# Patient Record
Sex: Female | Born: 1950 | Race: Black or African American | Hispanic: No | Marital: Single | Smoking: Former smoker
Health system: Southern US, Community
[De-identification: ages and names within clinical notes are randomized; demographics above are authoritative.]

## PROBLEM LIST (undated history)

## (undated) DIAGNOSIS — I5181 Takotsubo syndrome: Secondary | ICD-10-CM

## (undated) DIAGNOSIS — I10 Essential (primary) hypertension: Secondary | ICD-10-CM

## (undated) DIAGNOSIS — K219 Gastro-esophageal reflux disease without esophagitis: Secondary | ICD-10-CM

## (undated) DIAGNOSIS — Z9289 Personal history of other medical treatment: Secondary | ICD-10-CM

## (undated) HISTORY — DX: Personal history of other medical treatment: Z92.89

## (undated) HISTORY — DX: Takotsubo syndrome: I51.81

---

## 2014-04-04 DIAGNOSIS — Z9289 Personal history of other medical treatment: Secondary | ICD-10-CM

## 2014-04-04 DIAGNOSIS — I5181 Takotsubo syndrome: Secondary | ICD-10-CM

## 2014-04-04 HISTORY — DX: Takotsubo syndrome: I51.81

## 2014-04-04 HISTORY — DX: Personal history of other medical treatment: Z92.89

## 2014-04-20 ENCOUNTER — Emergency Department (HOSPITAL_COMMUNITY): Payer: BC Managed Care – PPO

## 2014-04-20 ENCOUNTER — Encounter (HOSPITAL_COMMUNITY): Payer: Self-pay | Admitting: Emergency Medicine

## 2014-04-20 ENCOUNTER — Inpatient Hospital Stay (HOSPITAL_COMMUNITY)
Admission: EM | Admit: 2014-04-20 | Discharge: 2014-04-22 | DRG: 281 | Disposition: A | Discharge status: Home / Self Care | Payer: BC Managed Care – PPO | Attending: Cardiology | Admitting: Cardiology

## 2014-04-20 DIAGNOSIS — I428 Other cardiomyopathies: Secondary | ICD-10-CM | POA: Diagnosis present

## 2014-04-20 DIAGNOSIS — I5181 Takotsubo syndrome: Secondary | ICD-10-CM

## 2014-04-20 DIAGNOSIS — I214 Non-ST elevation (NSTEMI) myocardial infarction: Principal | ICD-10-CM | POA: Diagnosis present

## 2014-04-20 DIAGNOSIS — Z87891 Personal history of nicotine dependence: Secondary | ICD-10-CM

## 2014-04-20 DIAGNOSIS — R7989 Other specified abnormal findings of blood chemistry: Secondary | ICD-10-CM

## 2014-04-20 DIAGNOSIS — I10 Essential (primary) hypertension: Secondary | ICD-10-CM | POA: Diagnosis present

## 2014-04-20 DIAGNOSIS — Z886 Allergy status to analgesic agent status: Secondary | ICD-10-CM

## 2014-04-20 DIAGNOSIS — K219 Gastro-esophageal reflux disease without esophagitis: Secondary | ICD-10-CM | POA: Diagnosis present

## 2014-04-20 DIAGNOSIS — I129 Hypertensive chronic kidney disease with stage 1 through stage 4 chronic kidney disease, or unspecified chronic kidney disease: Secondary | ICD-10-CM | POA: Diagnosis present

## 2014-04-20 DIAGNOSIS — N189 Chronic kidney disease, unspecified: Secondary | ICD-10-CM | POA: Diagnosis present

## 2014-04-20 DIAGNOSIS — Z23 Encounter for immunization: Secondary | ICD-10-CM

## 2014-04-20 DIAGNOSIS — Z8249 Family history of ischemic heart disease and other diseases of the circulatory system: Secondary | ICD-10-CM

## 2014-04-20 DIAGNOSIS — I059 Rheumatic mitral valve disease, unspecified: Secondary | ICD-10-CM | POA: Diagnosis present

## 2014-04-20 HISTORY — DX: Essential (primary) hypertension: I10

## 2014-04-20 HISTORY — DX: Gastro-esophageal reflux disease without esophagitis: K21.9

## 2014-04-20 LAB — CBC WITH DIFFERENTIAL/PLATELET
BASOS ABS: 0 10*3/uL (ref 0.0–0.1)
BASOS PCT: 0 % (ref 0–1)
EOS ABS: 0 10*3/uL (ref 0.0–0.7)
EOS PCT: 0 % (ref 0–5)
HEMATOCRIT: 42.8 % (ref 36.0–46.0)
Hemoglobin: 14.7 g/dL (ref 12.0–15.0)
LYMPHS PCT: 27 % (ref 12–46)
Lymphs Abs: 2.6 10*3/uL (ref 0.7–4.0)
MCH: 31.3 pg (ref 26.0–34.0)
MCHC: 34.3 g/dL (ref 30.0–36.0)
MCV: 91.3 fL (ref 78.0–100.0)
MONO ABS: 0.7 10*3/uL (ref 0.1–1.0)
Monocytes Relative: 7 % (ref 3–12)
Neutro Abs: 6.5 10*3/uL (ref 1.7–7.7)
Neutrophils Relative %: 66 % (ref 43–77)
PLATELETS: 353 10*3/uL (ref 150–400)
RBC: 4.69 MIL/uL (ref 3.87–5.11)
RDW: 13.2 % (ref 11.5–15.5)
WBC: 9.8 10*3/uL (ref 4.0–10.5)

## 2014-04-20 LAB — TROPONIN I
TROPONIN I: 2.08 ng/mL — AB (ref <0.30)
Troponin I: 1.4 ng/mL (ref <0.30)

## 2014-04-20 LAB — BASIC METABOLIC PANEL
BUN: 17 mg/dL (ref 6–23)
CHLORIDE: 102 meq/L (ref 96–112)
CO2: 24 meq/L (ref 19–32)
Calcium: 10.7 mg/dL — ABNORMAL HIGH (ref 8.4–10.5)
Creatinine, Ser: 1.11 mg/dL — ABNORMAL HIGH (ref 0.50–1.10)
GFR calc Af Amer: 61 mL/min — ABNORMAL LOW (ref >90)
GFR calc non Af Amer: 52 mL/min — ABNORMAL LOW (ref >90)
GLUCOSE: 143 mg/dL — AB (ref 70–99)
Potassium: 4 mEq/L (ref 3.7–5.3)
Sodium: 144 mEq/L (ref 137–147)

## 2014-04-20 LAB — HEPARIN LEVEL (UNFRACTIONATED): HEPARIN UNFRACTIONATED: 0.93 [IU]/mL — AB (ref 0.30–0.70)

## 2014-04-20 LAB — MRSA PCR SCREENING: MRSA by PCR: NEGATIVE

## 2014-04-20 LAB — PRO B NATRIURETIC PEPTIDE: PRO B NATRI PEPTIDE: 14801 pg/mL — AB (ref 0–125)

## 2014-04-20 MED ORDER — ATORVASTATIN CALCIUM 80 MG PO TABS
80.0000 mg | ORAL_TABLET | Freq: Every day | ORAL | Status: DC
  Administered 2014-04-20 – 2014-04-21 (×2): 80 mg via ORAL
  Filled 2014-04-20 (×4): qty 1

## 2014-04-20 MED ORDER — DOXEPIN HCL 75 MG PO CAPS
75.0000 mg | ORAL_CAPSULE | Freq: Every day | ORAL | Status: DC
  Administered 2014-04-21 – 2014-04-22 (×2): 75 mg via ORAL
  Filled 2014-04-20 (×2): qty 1

## 2014-04-20 MED ORDER — HEPARIN SODIUM (PORCINE) 5000 UNIT/ML IJ SOLN: 4000.0000 [IU] | Freq: Once | INTRAMUSCULAR | Status: DC

## 2014-04-20 MED ORDER — HEPARIN (PORCINE) IN NACL 100-0.45 UNIT/ML-% IJ SOLN
600.0000 [IU]/h | INTRAMUSCULAR | Status: DC
  Administered 2014-04-20: 600 [IU]/h via INTRAVENOUS
  Filled 2014-04-20: qty 250

## 2014-04-20 MED ORDER — SODIUM CHLORIDE 0.9 % IJ SOLN: 3.0000 mL | INTRAMUSCULAR | Status: DC | PRN

## 2014-04-20 MED ORDER — SODIUM CHLORIDE 0.9 % IV SOLN: 1.0000 mL/kg/h | INTRAVENOUS | Status: DC

## 2014-04-20 MED ORDER — ASPIRIN 81 MG PO CHEW
324.0000 mg | CHEWABLE_TABLET | Freq: Once | ORAL | Status: AC
  Administered 2014-04-20: 324 mg via ORAL
  Filled 2014-04-20: qty 4

## 2014-04-20 MED ORDER — SODIUM CHLORIDE 0.9 % IV SOLN: 250.0000 mL | INTRAVENOUS | Status: DC | PRN

## 2014-04-20 MED ORDER — PANTOPRAZOLE SODIUM 40 MG PO TBEC
40.0000 mg | DELAYED_RELEASE_TABLET | Freq: Every day | ORAL | Status: DC
  Administered 2014-04-21 – 2014-04-22 (×2): 40 mg via ORAL
  Filled 2014-04-20 (×2): qty 1

## 2014-04-20 MED ORDER — CLONIDINE HCL 0.1 MG PO TABS
0.1000 mg | ORAL_TABLET | Freq: Two times a day (BID) | ORAL | Status: DC
  Administered 2014-04-21 – 2014-04-22 (×3): 0.1 mg via ORAL
  Filled 2014-04-20 (×5): qty 1

## 2014-04-20 MED ORDER — HEPARIN (PORCINE) IN NACL 100-0.45 UNIT/ML-% IJ SOLN
500.0000 [IU]/h | INTRAMUSCULAR | Status: DC
  Filled 2014-04-20: qty 250

## 2014-04-20 MED ORDER — ASPIRIN EC 81 MG PO TBEC
81.0000 mg | DELAYED_RELEASE_TABLET | Freq: Every day | ORAL | Status: DC
  Administered 2014-04-21 – 2014-04-22 (×2): 81 mg via ORAL
  Filled 2014-04-20 (×2): qty 1

## 2014-04-20 MED ORDER — SODIUM CHLORIDE 0.9 % IV BOLUS (SEPSIS)
250.0000 mL | Freq: Once | INTRAVENOUS | Status: AC
  Administered 2014-04-20: 250 mL via INTRAVENOUS

## 2014-04-20 MED ORDER — SODIUM CHLORIDE 0.9 % IJ SOLN
3.0000 mL | Freq: Two times a day (BID) | INTRAMUSCULAR | Status: DC
  Administered 2014-04-21: 3 mL via INTRAVENOUS

## 2014-04-20 MED ORDER — SODIUM CHLORIDE 0.9 % IV SOLN
INTRAVENOUS | Status: AC
  Administered 2014-04-20: 16:00:00 via INTRAVENOUS

## 2014-04-20 MED ORDER — ASPIRIN 81 MG PO CHEW
81.0000 mg | CHEWABLE_TABLET | ORAL | Status: AC
  Administered 2014-04-21: 81 mg via ORAL
  Filled 2014-04-20: qty 1

## 2014-04-20 MED ORDER — HEPARIN (PORCINE) IN NACL 100-0.45 UNIT/ML-% IJ SOLN: 14.0000 [IU]/kg/h | Freq: Once | INTRAMUSCULAR | Status: DC

## 2014-04-20 MED ORDER — SODIUM CHLORIDE 0.9 % IV SOLN
INTRAVENOUS | Status: DC
Start: 2014-04-20 — End: 2014-04-22
  Administered 2014-04-20: 16:00:00 via INTRAVENOUS

## 2014-04-20 MED ORDER — HEPARIN BOLUS VIA INFUSION
3000.0000 [IU] | Freq: Once | INTRAVENOUS | Status: AC
  Administered 2014-04-20: 3000 [IU] via INTRAVENOUS

## 2014-04-20 MED ORDER — ACETAMINOPHEN 325 MG PO TABS: 650.0000 mg | ORAL_TABLET | ORAL | Status: DC | PRN

## 2014-04-20 MED ORDER — METOPROLOL TARTRATE 1 MG/ML IV SOLN
5.0000 mg | Freq: Once | INTRAVENOUS | Status: AC
  Administered 2014-04-20: 5 mg via INTRAVENOUS
  Filled 2014-04-20: qty 5

## 2014-04-20 MED ORDER — ONDANSETRON HCL 4 MG/2ML IJ SOLN: 4.0000 mg | Freq: Four times a day (QID) | INTRAMUSCULAR | Status: DC | PRN

## 2014-04-20 MED ORDER — NITROGLYCERIN 0.4 MG SL SUBL: 0.4000 mg | SUBLINGUAL_TABLET | SUBLINGUAL | Status: DC | PRN

## 2014-04-20 NOTE — ED Notes (Signed)
Cp with n/weakness/sob/dizziness since last night.

## 2014-04-20 NOTE — ED Notes (Signed)
CRITICAL VALUE ALERT  Critical value received:  Trop 2.0  dae of notification:  04/20/2014  Time of notification:  1422  Critical value read back:yes  Nurse who received alert:  SDW

## 2014-04-20 NOTE — Progress Notes (Signed)
ANTICOAGULATION CONSULT NOTE - Initial Consult  Pharmacy Consult for Heparin Indication: chest pain/ACS  Allergies  Allergen Reactions  . Asa [Aspirin]     Patient Measurements: Height: 5\' 6"  (167.6 cm) Weight: 112 lb (50.803 kg) IBW/kg (Calculated) : 59.3  Vital Signs: Temp: 97.5 F (36.4 C) (06/17 1302) BP: 135/90 mmHg (06/17 1302) Pulse Rate: 109 (06/17 1302)  Labs:  Recent Labs  04/20/14 1315  HGB 14.7  HCT 42.8  PLT 353  CREATININE 1.11*  TROPONINI 2.08*    Estimated Creatinine Clearance: 42.7 ml/min (by C-G formula based on Cr of 1.11).   Medical History: Past Medical History  Diagnosis Date  . Hypertension   . GERD (gastroesophageal reflux disease)     Medications:  Scheduled:    Assessment: 63 yo F admit with chest pain.  CBC reviewed.  No bleeding noted.    Goal of Therapy:  Heparin level 0.3-0.7 units/ml Monitor platelets by anticoagulation protocol: Yes   Plan:  Give 3000 units bolus x 1 Start heparin infusion at 600 units/hr Check anti-Xa level in 6 hours and daily while on heparin Continue to monitor H&H and platelets  [place], [facility] 04/20/2014,2:20 PM

## 2014-04-20 NOTE — H&P (Signed)
Patient ID: Krystal Travis MRN: [id number], DOB/AGE: January 10, 1951   Admit date: 04/20/2014   Primary Physician: Wynona Meals, FNP Primary Cardiologist: New  Pt. Profile:  Krystal Travis is a 63 y.o. Female with a history of HTN, GERD and no prior cardiac history who presented to the [facility] today complaining of chest pain. Her troponin was noted to be elevated and she was sent to [facility] for further management.  She complains of intermittent, aching substernal CP that radiated to her left arm beginning last night at 5:00 PM. She was restless all night and in the morning experienced intermittent chest pain lasting about 10 minutes at a time. She also noted associated lightheadedness, nausea, lower back pain, diaphoresis and SOB. Patient rated the pain as a 4/10 in severity. She is not having any chest discomfort at this time. She has no prior history of similar episodes or prior cardiac disease. She denies PND, orthopnea or LE edema. Her mother recently had a MI and she is currently on FMLA to help care for her. She currently works for a group home. She does not smoke and has no history of bleeding.     Problem List  Past Medical History  Diagnosis Date  . Hypertension   . GERD (gastroesophageal reflux disease)   . CKD (chronic kidney disease)     mild     History reviewed. No pertinent past surgical history.   Allergies  Allergies  Allergen Reactions  . Asa [Aspirin]      Home Medications  Prior to Admission medications   Medication Sig Start Date End Date Taking? Authorizing Provider  cloNIDine (CATAPRES) 0.1 MG tablet Take 0.1 mg by mouth 2 (two) times daily. 04/01/14  Yes Historical Provider, MD  doxepin (SINEQUAN) 75 MG capsule Take 75 mg by mouth daily. 03/24/14  Yes Historical Provider, MD  omeprazole (PRILOSEC) 20 MG capsule Take 20 mg by mouth 2 (two) times daily. 04/01/14  Yes Historical Provider, MD  verapamil (CALAN-SR) 240 MG CR tablet Take 240 mg by mouth daily.  04/01/14  Yes Historical Provider, MD    Family History  Family History  Problem Relation Age of Onset  . Heart attack Mother    Family Status  Relation Status Death Age  . Mother Alive      Social History  History   Social History  . Marital Status: Single    Spouse Name: N/A    Number of Children: N/A  . Years of Education: N/A   Occupational History  . Not on file.   Social History Main Topics  . Smoking status: Former Smoker    Quit date: 04/20/2006  . Smokeless tobacco: Not on file  . Alcohol Use: Yes     Comment: occasional  . Drug Use: No  . Sexual Activity: Not on file   Other Topics Concern  . Not on file   Social History Narrative  . No narrative on file     Review of Systems General:  No chills, fever, night sweats or weight changes.  Cardiovascular:  ++ chest pain, dyspnea on exertion, edema, orthopnea, palpitations, paroxysmal nocturnal dyspnea. Dermatological: No rash, lesions/masses Respiratory: No cough, dyspnea Urologic: No hematuria, dysuria Abdominal:   No nausea, vomiting, diarrhea, bright red blood per rectum, melena, or hematemesis Neurologic:  No visual changes, wkns, changes in mental status. All other systems reviewed and are otherwise negative except as noted above.  Physical Exam  Blood pressure 105/74, pulse 79, temperature  98.5 F (36.9 C), temperature source Oral, resp. rate 15, height 5\' 6"  (1.676 m), weight 112 lb (50.803 kg), SpO2 98.00%.  General: Pleasant, NAD Psych: Normal affect. Neuro: Alert and oriented X 3. Moves all extremities spontaneously. HEENT: Normal  Neck: Supple without bruits or JVD. Lungs:  Resp regular and unlabored, CTA. Heart: RRR no s3, s4, or murmurs. + S3 gallop Abdomen: Soft, non-tender, non-distended, BS + x 4.  Extremities: No clubbing, cyanosis or edema. DP/PT/Radials 2+ and equal bilaterally.  Labs   Recent Labs  04/20/14 1315  TROPONINI 2.08*   Lab Results  Component Value Date     WBC 9.8 04/20/2014   HGB 14.7 04/20/2014   HCT 42.8 04/20/2014   MCV 91.3 04/20/2014   PLT 353 04/20/2014     Recent Labs Lab 04/20/14 1315  NA 144  K 4.0  CL 102  CO2 24  BUN 17  CREATININE 1.11*  CALCIUM 10.7*  GLUCOSE 143*    Radiology/Studies  Dg Chest Port 1 View  04/20/2014   CLINICAL DATA:  Chest pain and weakness with history of hypertension ; previous tobacco use.  EXAM: PORTABLE CHEST - 1 VIEW  COMPARISON:  None.  FINDINGS: The lungs are well-expanded and clear. The heart and mediastinal structures are normal. There is no pleural effusion. The bony thorax exhibits no acute abnormality.  IMPRESSION: COPD.  There is no acute cardiopulmonary disease.      ECG  Not in system, but non specific ST changes per report from [facility]. Probable anterior infarct age undetermined. Also diffuse T-wave inversions suggestive of diffuse ischemia.   ASSESSMENT AND PLAN  Krystal Travis is a 63 y.o. Female with a history of HTN, GERD and no prior cardiac history who presented to the [facility] ED today complaining of chest pain. Her troponin was noted to be elevated and [facility].   NSTEMI- trop 2.08. Chest pain free now. Hold off on NTG gtt if remains CP free -- She was placed on heparin and will continue on this -- Will start ASA, statin. WIll hold off on BB for now due to soft BPs -- Cardiac cath tomorrow- NPO after midnight  Elevated BNP- 14.8 K. She has no s/s CHF. Her CXR is clear.  -- Will order a 2D ECHO.  HTN- continue clonidine 0.1 BID and verapamil 240mg  CR  GERD- continue PPI  Signed, Thereasa ParkinSTERN, KATHRYN, PA-C 04/20/2014, 7:56 PM  Pager [phone]  The patient was seen on the step down unit with Thereasa ParkinKathryn Stern, PA-C.  The patient is not currently having any chest discomfort.  She is not short of breath.  She began having intermittent chest discomfort yesterday afternoon with intermittent radiation down the left arm.  She had intermittent episodes of  diaphoresis but no nausea and vomiting.  [facility] went to [facility] emergency room where her EKG shows sinus rhythm with wide spread T wave inversion in inferolateral leads and her serum troponin was greater than 2.  She also had a significantly elevated proBNP at 14,800 although no other symptoms or signs of CHF.  Her chest x-ray was reviewed personally by me and shows normal heart size and clear lungs without evidence of CHF.  Her physical examination reveals an S4 gallop suggesting possible diastolic dysfunction.  Will check echocardiogram.  Her chart lists aspirin as an allergy.  I talked with her about this.  She is not allergic to aspirin.  However too much aspirin or Advil causes  her to have gastritis symptoms. She will need beta blocker going forward as treatment for her coronary artery disease and for that reason I will not continue the verapamil.  Tonight her blood pressure is soft and we will hold off on starting a beta blocker until her blood pressure improves.  We will plan for cardiac catheterization in a.m.The risks and benefits of a cardiac catheterization including, but not limited to, death, stroke, MI, kidney damage and bleeding were discussed with the patient who indicates understanding and agrees to proceed.

## 2014-04-20 NOTE — Progress Notes (Signed)
ANTICOAGULATION CONSULT NOTE - Follow-Up Consult  Pharmacy Consult for Heparin Indication: chest pain/ACS  Allergies  Allergen Reactions  . Asa [Aspirin]     Hurts stomach - pt can still take.     Patient Measurements: Height: 5\' 6"  (167.6 cm) Weight: 112 lb (50.803 kg) IBW/kg (Calculated) : 59.3  Vital Signs: Temp: 98.5 F (36.9 C) (06/17 1946) Temp src: Oral (06/17 1946) BP: 103/72 mmHg (06/17 2130) Pulse Rate: 81 (06/17 2130)  Labs:  Recent Labs  04/20/14 1315 04/20/14 2124  HGB 14.7  --   HCT 42.8  --   PLT 353  --   HEPARINUNFRC  --  0.93*  CREATININE 1.11*  --   TROPONINI 2.08*  --     Estimated Creatinine Clearance: 42.7 ml/min (by C-G formula based on Cr of 1.11).   Medical History: Past Medical History  Diagnosis Date  . Hypertension   . GERD (gastroesophageal reflux disease)     Medications:  Scheduled:  . sodium chloride   Intravenous STAT  . [START ON 04/21/2014] aspirin EC  81 mg Oral Daily  . atorvastatin  80 mg Oral q1800  . cloNIDine  0.1 mg Oral BID  . [START ON 04/21/2014] doxepin  75 mg Oral Daily  . [START ON 04/21/2014] pantoprazole  40 mg Oral QAC breakfast    Assessment: 63 yo F admit with chest pain.  CBC reviewed.  No bleeding noted.  Initial heparin level supratherapeutic on 600 units/hr.  Goal of Therapy:  Heparin level 0.3-0.7 units/ml Monitor platelets by anticoagulation protocol: Yes   Plan:  1. Decrease IV heparin to 500 units/hr. 2. Will recheck heparin level with AM labs. 3. F/u daily heparin level and CBC. 4. F/u plans after cath tomorrow.  Tad Moore, BCPS  Clinical Pharmacist Pager [phone]  04/20/2014 10:13 PM

## 2014-04-20 NOTE — ED Notes (Signed)
Continues to deny pain  

## 2014-04-20 NOTE — ED Provider Notes (Addendum)
CSN: [id number]     Arrival date & time 04/20/14  1252 History  This chart was scribed for Vanetta Mulders, MD by Leona Carry, ED Scribe. The patient was seen in [facility]. The patient's care was started at 1:54 PM.   Chief Complaint  Patient presents with  . Chest Pain    Patient is a 63 y.o. female presenting with chest pain. The history is provided by the patient. No language interpreter was used.  Chest Pain Pain location:  Substernal area Pain quality: aching   Pain radiates to:  L arm Pain radiates to the back: no   Onset quality:  Gradual Duration:  1 day Timing:  Intermittent Chronicity:  New Ineffective treatments:  None tried Associated symptoms: back pain (lower back), diaphoresis, dizziness, nausea and shortness of breath   Associated symptoms: no abdominal pain, no cough, no fever, no headache and not vomiting   Risk factors: hypertension   Risk factors: no coronary artery disease    HPI Comments: Krystal Travis is a 63 y.o. female who presents to the Emergency Department complaining of intermittent, aching substernal CP beginning last night at 5:00 PM. She states that the pain radiates to her left arm, but does not radiate into her back. Patient rates that pain as a 4/10 in severity. Patient states that each pain episode lasts about 10 minutes. She denies modifying factors. She has no prior history of similar pain episodes. Patient is not having any chest discomfort at this time. She reports associated nausea, SOB, and dizziness, and lower back pain. She denies fever, vomiting, abdominal pain, visual changes, cough, rhinorrhea, sore throat, dysuria, leg swelling, rash, hx of bleeding easily, neck pain, HA, or confusion. She has no history of cardiac disease.  PCP is Dr[facility] Lyn Hollingshead at [facility]  Past Medical History  Diagnosis Date  . Hypertension   . GERD (gastroesophageal reflux disease)    History reviewed. No pertinent past surgical  history. History reviewed. No pertinent family history. History  Substance Use Topics  . Smoking status: Former Smoker    Quit date: 04/20/2006  . Smokeless tobacco: Not on file  . Alcohol Use: Yes     Comment: occasional   OB History   Grav Para Term Preterm Abortions TAB SAB Ect Mult Living                 Review of Systems  Constitutional: Positive for chills and diaphoresis. Negative for fever.  HENT: Negative for congestion, rhinorrhea and sore throat.   Eyes: Negative for visual disturbance.  Respiratory: Positive for shortness of breath. Negative for cough.   Cardiovascular: Positive for chest pain. Negative for leg swelling.  Gastrointestinal: Positive for nausea. Negative for vomiting, abdominal pain and diarrhea.  Genitourinary: Negative for dysuria and difficulty urinating.  Musculoskeletal: Positive for back pain (lower back). Negative for neck pain.  Skin: Negative for rash.  Neurological: Positive for dizziness. Negative for light-headedness and headaches.  Hematological: Does not bruise/bleed easily.  Psychiatric/Behavioral: Negative for confusion.    Allergies  Asa  Home Medications   Prior to Admission medications   Medication Sig Start Date End Date Taking? Authorizing Provider  cloNIDine (CATAPRES) 0.1 MG tablet Take 0.1 mg by mouth 2 (two) times daily. 04/01/14  Yes Historical Provider, MD  doxepin (SINEQUAN) 75 MG capsule Take 75 mg by mouth daily. 03/24/14  Yes Historical Provider, MD  omeprazole (PRILOSEC) 20 MG capsule Take 20 mg by mouth 2 (two) times daily. 04/01/14  Yes Historical Provider, MD  verapamil (CALAN-SR) 240 MG CR tablet Take 240 mg by mouth daily. 04/01/14  Yes Historical Provider, MD   Triage Vitals: BP 135/90  Pulse 109  Temp(Src) 97.5 F (36.4 C)  Resp 18  Ht 5\' 6"  (1.676 m)  Wt 112 lb (50.803 kg)  BMI 18.09 kg/m2  SpO2 100% Physical Exam  Nursing note and vitals reviewed. Constitutional: She is oriented to person, place, and  time. She appears well-developed and well-nourished. No distress.  HENT:  Head: Normocephalic and atraumatic.  Eyes: Conjunctivae and EOM are normal.  Neck: Neck supple. No tracheal deviation present.  Cardiovascular: Tachycardia present.   Pulmonary/Chest: Effort normal and breath sounds normal. No respiratory distress. She has no wheezes. She has no rales.  O2 saturation is 100% on room air.  Abdominal: Bowel sounds are normal. There is no tenderness.  Musculoskeletal: Normal range of motion. She exhibits no edema.  Neurological: She is alert and oriented to person, place, and time. No cranial nerve deficit.  Skin: Skin is warm and dry.  Psychiatric: She has a normal mood and affect. Her behavior is normal.    ED Course  Procedures (including critical care time) DIAGNOSTIC STUDIES: Oxygen Saturation is 100% on room air, normal by my interpretation.    COORDINATION OF CARE: 2:15 PM-Discussed treatment plan which includes IV fluids, heparin bolus, head CT, CXR, BNP, troponin, BMP, CBC with diff with pt at bedside and pt agreed to plan.    Labs Review Labs Reviewed  TROPONIN I - Abnormal; Notable for the following:    Troponin I 2.08 (*)    All other components within normal limits  BASIC METABOLIC PANEL - Abnormal; Notable for the following:    Glucose, Bld 143 (*)    Creatinine, Ser 1.11 (*)    Calcium 10.7 (*)    GFR calc non Af Amer 52 (*)    GFR calc Af Amer 61 (*)    All other components within normal limits  PRO B NATRIURETIC PEPTIDE - Abnormal; Notable for the following:    Pro B Natriuretic peptide (BNP) 14801.0 (*)    All other components within normal limits  CBC WITH DIFFERENTIAL   Results for orders placed during the hospital encounter of 04/20/14  TROPONIN I      Result Value Ref Range   Troponin I 2.08 (*) <0.30 ng/mL  BASIC METABOLIC PANEL      Result Value Ref Range   Sodium 144  137 - 147 mEq/L   Potassium 4.0  3.7 - 5.3 mEq/L   Chloride 102  96 - 112  mEq/L   CO2 24  19 - 32 mEq/L   Glucose, Bld 143 (*) 70 - 99 mg/dL   BUN 17  6 - 23 mg/dL   Creatinine, Ser 9.141.11 (*) 0.50 - 1.10 mg/dL   Calcium 78.210.7 (*) 8.4 - 10.5 mg/dL   GFR calc non Af Amer 52 (*) >90 mL/min   GFR calc Af Amer 61 (*) >90 mL/min  CBC WITH DIFFERENTIAL      Result Value Ref Range   WBC 9.8  4.0 - 10.5 K/uL   RBC 4.69  3.87 - 5.11 MIL/uL   Hemoglobin 14.7  12.0 - 15.0 g/dL   HCT 95.642.8  21.336.0 - 08.646.0 %   MCV 91.3  78.0 - 100.0 fL   MCH 31.3  26.0 - 34.0 pg   MCHC 34.3  30.0 - 36.0 g/dL   RDW 57.813.2  46.911.5 -  15.5 %   Platelets 353  150 - 400 K/uL   Neutrophils Relative % 66  43 - 77 %   Neutro Abs 6.5  1.7 - 7.7 K/uL   Lymphocytes Relative 27  12 - 46 %   Lymphs Abs 2.6  0.7 - 4.0 K/uL   Monocytes Relative 7  3 - 12 %   Monocytes Absolute 0.7  0.1 - 1.0 K/uL   Eosinophils Relative 0  0 - 5 %   Eosinophils Absolute 0.0  0.0 - 0.7 K/uL   Basophils Relative 0  0 - 1 %   Basophils Absolute 0.0  0.0 - 0.1 K/uL  PRO B NATRIURETIC PEPTIDE      Result Value Ref Range   Pro B Natriuretic peptide (BNP) 14801.0 (*) 0 - 125 pg/mL     Imaging Review Dg Chest Port 1 View  04/20/2014   CLINICAL DATA:  Chest pain and weakness with history of hypertension ; previous tobacco use.  EXAM: PORTABLE CHEST - 1 VIEW  COMPARISON:  None.  FINDINGS: The lungs are well-expanded and clear. The heart and mediastinal structures are normal. There is no pleural effusion. The bony thorax exhibits no acute abnormality.  IMPRESSION: COPD.  There is no acute cardiopulmonary disease.   Electronically Signed   By: David  [place]   On: 04/20/2014 15:34     EKG Interpretation None      Date: 04/20/2014  Rate: 103  Rhythm: sinus tachycardia  QRS Axis: normal  Intervals: QT prolonged  ST/T Wave abnormalities: nonspecific ST changes  Conduction Disutrbances:none  Narrative Interpretation:   Old EKG Reviewed: none available Probable anterior infarct age undetermined. Also diffuse T-wave inversions  suggestive of diffuse ischemia.  CRITICAL CARE Performed by: Vanetta Mulders Total critical care time: 30 Critical care time was exclusive of separately billable procedures and treating other patients. Critical care was necessary to treat or prevent imminent or life-threatening deterioration. Critical care was time spent personally by me on the following activities: development of treatment plan with patient and/or surrogate as well as nursing, discussions with consultants, evaluation of patient's response to treatment, examination of patient, obtaining history from patient or surrogate, ordering and performing treatments and interventions, ordering and review of laboratory studies, ordering and review of radiographic studies, pulse oximetry and re-evaluation of patient's condition.    MDM   Final diagnoses:  Non-STEMI (non-ST elevated myocardial infarction)    The patient is clear without any chest pain lasting 15 or 20 minutes states that most of his 10 minutes but it's been recurrent. Could represent unstable angina or a non-STEMI. EKG does show ST inversion inferiorly and laterally. Patient currently having no chest pain. Patient not truly allergic to aspirin so given aspirin. Patient started on heparin. We'll discuss with cardiology at [facility]. As stated patient currently chest pain-free. If tachycardia persist will treat with Lopressor.  I personally performed the services described in this documentation, which was scribed in my presence. The recorded information has been reviewed and is accurate.     Vanetta Mulders, MD 04/20/14 1519  Vanetta Mulders, MD 04/20/14 1540

## 2014-04-21 ENCOUNTER — Encounter (HOSPITAL_COMMUNITY)
Admission: EM | Disposition: A | Discharge status: Home / Self Care | Payer: Self-pay | Source: Home / Self Care | Attending: Cardiology

## 2014-04-21 DIAGNOSIS — I059 Rheumatic mitral valve disease, unspecified: Secondary | ICD-10-CM

## 2014-04-21 DIAGNOSIS — I214 Non-ST elevation (NSTEMI) myocardial infarction: Secondary | ICD-10-CM

## 2014-04-21 HISTORY — PX: LEFT HEART CATHETERIZATION WITH CORONARY ANGIOGRAM: SHX5451

## 2014-04-21 LAB — CBC
HEMATOCRIT: 36.3 % (ref 36.0–46.0)
HEMOGLOBIN: 12.3 g/dL (ref 12.0–15.0)
MCH: 30.8 pg (ref 26.0–34.0)
MCHC: 33.9 g/dL (ref 30.0–36.0)
MCV: 90.8 fL (ref 78.0–100.0)
Platelets: 285 10*3/uL (ref 150–400)
RBC: 4 MIL/uL (ref 3.87–5.11)
RDW: 13 % (ref 11.5–15.5)
WBC: 8.9 10*3/uL (ref 4.0–10.5)

## 2014-04-21 LAB — LIPID PANEL
CHOL/HDL RATIO: 2.4 ratio
CHOLESTEROL: 231 mg/dL — AB (ref 0–200)
HDL: 95 mg/dL (ref >39)
LDL Cholesterol: 120 mg/dL — ABNORMAL HIGH (ref 0–99)
TRIGLYCERIDES: 82 mg/dL (ref <150)
VLDL: 16 mg/dL (ref 0–40)

## 2014-04-21 LAB — PROTIME-INR
INR: 1.11 (ref 0.00–1.49)
PROTHROMBIN TIME: 14.1 s (ref 11.6–15.2)

## 2014-04-21 LAB — BASIC METABOLIC PANEL
BUN: 17 mg/dL (ref 6–23)
CALCIUM: 9.8 mg/dL (ref 8.4–10.5)
CO2: 23 meq/L (ref 19–32)
Chloride: 104 mEq/L (ref 96–112)
Creatinine, Ser: 1.1 mg/dL (ref 0.50–1.10)
GFR calc Af Amer: 61 mL/min — ABNORMAL LOW (ref >90)
GFR calc non Af Amer: 53 mL/min — ABNORMAL LOW (ref >90)
GLUCOSE: 104 mg/dL — AB (ref 70–99)
POTASSIUM: 3.7 meq/L (ref 3.7–5.3)
Sodium: 143 mEq/L (ref 137–147)

## 2014-04-21 LAB — HEPARIN LEVEL (UNFRACTIONATED)
Heparin Unfractionated: 0.46 IU/mL (ref 0.30–0.70)
Heparin Unfractionated: 0.51 IU/mL (ref 0.30–0.70)

## 2014-04-21 LAB — TROPONIN I
Troponin I: 1.24 ng/mL (ref <0.30)
Troponin I: 0.73 ng/mL (ref <0.30)

## 2014-04-21 SURGERY — LEFT HEART CATHETERIZATION WITH CORONARY ANGIOGRAM
Anesthesia: LOCAL

## 2014-04-21 MED ORDER — CARVEDILOL 3.125 MG PO TABS
3.1250 mg | ORAL_TABLET | Freq: Two times a day (BID) | ORAL | Status: DC
  Administered 2014-04-21 – 2014-04-22 (×3): 3.125 mg via ORAL
  Filled 2014-04-21 (×6): qty 1

## 2014-04-21 MED ORDER — HEPARIN SODIUM (PORCINE) 5000 UNIT/ML IJ SOLN
5000.0000 [IU] | Freq: Three times a day (TID) | INTRAMUSCULAR | Status: DC
  Filled 2014-04-21 (×3): qty 1

## 2014-04-21 MED ORDER — HEPARIN SODIUM (PORCINE) 5000 UNIT/ML IJ SOLN
5000.0000 [IU] | Freq: Three times a day (TID) | INTRAMUSCULAR | Status: DC
  Administered 2014-04-21 – 2014-04-22 (×3): 5000 [IU] via SUBCUTANEOUS
  Filled 2014-04-21 (×6): qty 1

## 2014-04-21 MED ORDER — HEPARIN SODIUM (PORCINE) 1000 UNIT/ML IJ SOLN
INTRAMUSCULAR | Status: AC
  Filled 2014-04-21: qty 1

## 2014-04-21 MED ORDER — LIDOCAINE HCL (PF) 1 % IJ SOLN
INTRAMUSCULAR | Status: AC
  Filled 2014-04-21: qty 30

## 2014-04-21 MED ORDER — PNEUMOCOCCAL VAC POLYVALENT 25 MCG/0.5ML IJ INJ
0.5000 mL | INJECTION | INTRAMUSCULAR | Status: AC
  Administered 2014-04-22: 0.5 mL via INTRAMUSCULAR
  Filled 2014-04-21: qty 0.5

## 2014-04-21 MED ORDER — SODIUM CHLORIDE 0.9 % IV SOLN
1.0000 mL/kg/h | INTRAVENOUS | Status: AC
  Administered 2014-04-21: 1 mL/kg/h via INTRAVENOUS

## 2014-04-21 MED ORDER — VERAPAMIL HCL 2.5 MG/ML IV SOLN
INTRAVENOUS | Status: AC
  Filled 2014-04-21: qty 2

## 2014-04-21 MED ORDER — FENTANYL CITRATE 0.05 MG/ML IJ SOLN
INTRAMUSCULAR | Status: AC
  Filled 2014-04-21: qty 2

## 2014-04-21 MED ORDER — NITROGLYCERIN 0.2 MG/ML ON CALL CATH LAB
INTRAVENOUS | Status: AC
  Filled 2014-04-21: qty 1

## 2014-04-21 MED ORDER — HEPARIN (PORCINE) IN NACL 2-0.9 UNIT/ML-% IJ SOLN
INTRAMUSCULAR | Status: AC
  Filled 2014-04-21: qty 1000

## 2014-04-21 MED ORDER — MIDAZOLAM HCL 2 MG/2ML IJ SOLN
INTRAMUSCULAR | Status: AC
  Filled 2014-04-21: qty 2

## 2014-04-21 NOTE — Interval H&P Note (Signed)
History and Physical Interval Note:  04/21/2014 7:50 AM  Krystal Travis  has presented today for surgery, with the diagnosis of NSTEMI  The various methods of treatment have been discussed with the patient and family. After consideration of risks, benefits and other options for treatment, the patient has consented to  Procedure(s): LEFT HEART CATHETERIZATION WITH CORONARY ANGIOGRAM (N/A) as a surgical intervention .  The patient's history has been reviewed, patient examined, no change in status, stable for surgery.  I have reviewed the patient's chart and labs.  Questions were answered to the patient's satisfaction.     HARDING,DAVID W  Cath Lab Visit (complete for each Cath Lab visit)  Clinical Evaluation Leading to the Procedure:   ACS: yes  Non-ACS:    Anginal Classification: CCS IV  Anti-ischemic medical therapy: Minimal Therapy (1 class of medications)  Non-Invasive Test Results: No non-invasive testing performed  Prior CABG: No previous CABG

## 2014-04-21 NOTE — Progress Notes (Signed)
Utilization review completed. Talibah Colasurdo, RN, BSN. 

## 2014-04-21 NOTE — Progress Notes (Signed)
  Echocardiogram 2D Echocardiogram has been performed.  CHUNG, [facility] 04/21/2014, 11:46 AM

## 2014-04-21 NOTE — Progress Notes (Signed)
Pt back from cath lab. Clean cors. Severe LV dysfunction c/w Takasubo CM. VSS. OK to transfer to tele. BB, ACE-I. D/C iv Hep. Will prob need 2D prior to D/C to determine need for LifeVest.  Runell Gess, M.D., FACP, [facility], Kathryne Eriksson [facility] [address] [place], [place]  [address]  [phone] 04/21/2014 10:32 AM

## 2014-04-21 NOTE — Progress Notes (Signed)
CRITICAL VALUE ALERT  Critical value received:  Trop 1.4  Date of notification:  04/20/14  Time of notification:  2220  Critical value read back:yes  Nurse who received alert:  Roselie Awkward, RN  MD notified (1st page):  N/A, lower than previously reported high value

## 2014-04-21 NOTE — Progress Notes (Signed)
ANTICOAGULATION CONSULT NOTE - Follow-Up Consult  Pharmacy Consult for Heparin Indication: chest pain/ACS  Allergies  Allergen Reactions  . Asa [Aspirin]     Hurts stomach - pt can still take.     Patient Measurements: Height: 5\' 6"  (167.6 cm) Weight: 112 lb (50.803 kg) IBW/kg (Calculated) : 59.3  Vital Signs: Temp: 99.3 F (37.4 C) (06/18 0021) Temp src: Oral (06/18 0021) BP: 121/61 mmHg (06/18 0200) Pulse Rate: 88 (06/18 0200)  Labs:  Recent Labs  04/20/14 1315 04/20/14 2124 04/21/14 0123 04/21/14 0500  HGB 14.7  --   --  12.3  HCT 42.8  --   --  36.3  PLT 353  --   --  285  LABPROT  --   --  14.1  --   INR  --   --  1.11  --   HEPARINUNFRC  --  0.93* 0.46  --   CREATININE 1.11*  --  1.10  --   TROPONINI 2.08* 1.40* 1.24*  --     Estimated Creatinine Clearance: 43.1 ml/min (by C-G formula based on Cr of 1.1).   Medical History: Past Medical History  Diagnosis Date  . Hypertension   . GERD (gastroesophageal reflux disease)     Medications:  Scheduled:  . sodium chloride   Intravenous STAT  . aspirin  81 mg Oral Pre-Cath  . aspirin EC  81 mg Oral Daily  . atorvastatin  80 mg Oral q1800  . cloNIDine  0.1 mg Oral BID  . doxepin  75 mg Oral Daily  . pantoprazole  40 mg Oral QAC breakfast  . [START ON 04/22/2014] pneumococcal 23 valent vaccine  0.5 mL Intramuscular Tomorrow-1000  . sodium chloride  3 mL Intravenous Q12H    Assessment: 63 yo F admit with chest pain.  CBC reviewed.  No bleeding noted.  Heparin level 0.46 units/ml  Goal of Therapy:  Heparin level 0.3-0.7 units/ml Monitor platelets by anticoagulation protocol: Yes   Plan:  1. Cont IV heparin at 500 units/hr. 2. Will recheck heparin level in 6 hours to confirm  Murvin Donning Clinical Pharmacist Pager [phone]  04/21/2014 2:54 AM

## 2014-04-21 NOTE — CV Procedure (Signed)
CARDIAC CATHETERIZATION REPORT  NAME:  Krystal Travis   MRN: [id number] DOB:  [birth year]   ADMIT DATE: 04/20/2014 Procedure Date: 04/21/2014  INTERVENTIONAL CARDIOLOGIST: Leonie Man, M.D., MS PRIMARY CARE Carmine Carrozza: Barbie Haggis, FNP PRIMARY CARDIOLOGIST: Darlin Coco, M.D.  PATIENT:  Krystal Travis is a 63 y.o. female transferred from [place] with a complaint of chest pain.  She ruled in for MI with positive troponins. She was stabilized with medications as the chest pain free since arrival. He now presents for invasive evaluation.  PRE-OPERATIVE DIAGNOSIS:    Non-STEMI  PROCEDURES PERFORMED:    Left Heart Catheterization with Native Coronary  Angiography  via Right Radial Artery   PROCEDURE: The patient was brought to the [facility]nd [facility] in the fasting state and prepped and draped in the usual sterile fashion for Right Radial artery access. A modified Allen's test was performed on the right wrist demonstrating excellent collateral flow for radial access.   Sterile technique was used including antiseptics, cap, gloves, gown, hand hygiene, mask and sheet. Skin prep: Chlorhexidine.   Consent: Risks of procedure as well as the alternatives and risks of each were explained to the (patient/caregiver). Consent for procedure obtained.   Time Out: Verified patient identification, verified procedure, site/side was marked, verified correct patient position, special equipment/implants available, medications/allergies/relevent history reviewed, required imaging and test results available. Performed.  Access:   Right Radial Artery: 6 Fr Sheath -  Seldinger Technique (Angiocath Micropuncture Kit)  Radial Cocktail - 10 mL; IV Heparin 2500 Units   Left Heart Catheterization: 5 Fr Catheters advanced initially over Versicore wire and exchanged over a Web designer J-wire;  Left & Right Coronary Artery Cineangiography: TIG 4.0 Catheter    LV Hemodynamics (LV Gram): With the TIG catheter across the aortic valve and the ventricle, this was exchanged for the angled pigtail catheter to  Sheath removed in the Cath Lab with a TR band placed for hemostasis.  TR Band: 0830  Hours; 11 mL air  FINDINGS:  Hemodynamics:   Central Aortic Pressure / Mean: 132/75/99 mmHg  Left Ventricular Pressure / LVEDP: 124/9/12 mmHg  Left Ventriculography:  EF: Roughly 25 %  Wall Motion: The basal walls contract relatively normally as does the apex but the inferior and anterior mid walls are severely hypokinetic,.  There is also mild mitral valve prolapse with no significant regurgitation.   Coronary Anatomy:  Dominance: Right   Left Main: Large-caliber, long vessel that bifurcates distally into the LAD and then extends to branch into a Ramus Intermedius and Circumflex. LAD: Normal caliber vessel that tapers into a relatively small caliber vessel down around the apex. It is somewhat tortuous course and gives off one major diagonal branch. As reaching down around the the apex perfusing the inferoapex.  D1: Small to moderate caliber vessel that comes off in the LAD. Angiographically normal but tortuous  Left Circumflex: The extension of the left main gives off a small AV groove circumflex bifurcates into a small OM and posterolateral branch with continuing AV groove branch is diminutive. Minimal luminal irregularities.  Ramus intermedius: The continued left main gives off a early bifurcating ramus intermedius one branch coursing as a diagonal and one is an OM. Both are tortuous and small-to-moderate in caliber.   RCA: Moderate caliber tortuous vessel with a downward takeoff to anterior. The first bend is actually upward and then his very tortuous in the mid vessel. It gives off a very small  Right  Posterior AV Groove Branch (RPAV) that has a small posterior lateral branch prior to terminating as the small and moderate caliber tortuous RPDA.  Minimal luminal irregularities.   MEDICATIONS:  Anesthesia:  Local Lidocaine 2 ml  Sedation:  1 mg IV Versed, 25 mcg IV fentanyl   Omnipaque Contrast: 75 ml  Anticoagulation:  IV Heparin 2500 Units Radial Cocktail: 5 mg Verapamil, 400 mcg NTG, 2 ml 2% Lidocaine in 10 ml NS  PATIENT DISPOSITION:    The patient was transferred to the PACU holding area in a hemodynamicaly stable, chest pain free condition.  The patient tolerated the procedure well, and there were no complications.  EBL:   < 5 ml  The patient was stable before, during, and after the procedure.  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  Ejection fraction estimated roughly 25% with basal contraction along with apical contraction but mid anterior and inferior hypokinesis  PLAN OF CARE:  The patient will return to the TCU for ongoing care. Standard post radial cath care.  Initiate CHF/cardiomyopathy medical regimen.  I have started low-dose carvedilol. She sees relatively compensated with normal LVEDP    HARDING,DAVID W, M.D., M.S. [facility] [address] [place], [place]  [address]  [phone]  04/21/2014 8:32 AM

## 2014-04-21 NOTE — Progress Notes (Signed)
Nutrition Brief Note   Patient identified on the Malnutrition Screening Tool (MST) Report   Wt Readings from Last 15 Encounters:  04/21/14 111 lb 1.8 oz (50.4 kg)  04/21/14 111 lb 1.8 oz (50.4 kg)    Body mass index is 17.94 kg/(m^2). Patient meets criteria for Underweight based on current BMI.   Current diet order is Heart, patient is consuming approximately 100% of meals at this time. Labs and medications reviewed.   Pt states that she has been eating well at home and has not recently lost any weight. She did report that she wasn't eating well for the past 2 days, but before this she was eating normally. She reports a great appetite. She states that she normally weighs 112-114 lbs.   No nutrition interventions warranted at this time. If nutrition issues arise, please consult RD.   Krystal Travis, BS Dietetic Intern Pager: [phone]   I agree with the above information and made appropriate revisions. Jarold Motto MS, RD, LDN Inpatient Registered Dietitian Pager: [phone] After-hours pager: [phone]

## 2014-04-21 NOTE — Progress Notes (Signed)
Pt taken to cath lab.  Jewelry and cell phone placed in drawer of bedside table in room at pt's request.  No family at bedside.   Roselie Awkward, RN

## 2014-04-22 ENCOUNTER — Other Ambulatory Visit: Payer: Self-pay

## 2014-04-22 DIAGNOSIS — I5181 Takotsubo syndrome: Secondary | ICD-10-CM

## 2014-04-22 DIAGNOSIS — I10 Essential (primary) hypertension: Secondary | ICD-10-CM | POA: Diagnosis present

## 2014-04-22 LAB — CBC
HCT: 33.2 % — ABNORMAL LOW (ref 36.0–46.0)
Hemoglobin: 11 g/dL — ABNORMAL LOW (ref 12.0–15.0)
MCH: 30.3 pg (ref 26.0–34.0)
MCHC: 33.1 g/dL (ref 30.0–36.0)
MCV: 91.5 fL (ref 78.0–100.0)
Platelets: 243 10*3/uL (ref 150–400)
RBC: 3.63 MIL/uL — AB (ref 3.87–5.11)
RDW: 12.9 % (ref 11.5–15.5)
WBC: 5.7 10*3/uL (ref 4.0–10.5)

## 2014-04-22 MED ORDER — CARVEDILOL 3.125 MG PO TABS
3.1250 mg | ORAL_TABLET | Freq: Two times a day (BID) | ORAL | Status: AC
Start: 2014-04-22 — End: ?

## 2014-04-22 MED ORDER — FUROSEMIDE 20 MG PO TABS: 20.0000 mg | ORAL_TABLET | Freq: Every day | ORAL | Status: AC | PRN

## 2014-04-22 NOTE — Discharge Summary (Signed)
Physician Discharge Summary    Cardiologist:  Brackbill(New) PCP: Wynona Meals, FNP  Patient ID: Krystal Travis MRN: [id number] DOB/AGE: [birth year] 63 y.o.  Admit date: 04/20/2014 Discharge date: 04/22/2014  Admission Diagnoses: Nstemi  Discharge Diagnoses:  Active Problems:   NSTEMI (non-ST elevated myocardial infarction)   HTN (hypertension)   Takotsubo cardiomyopathy   Discharged Condition: stable  Hospital Course:   Krystal Travis is a 63 y.o. Female with a history of HTN, GERD and no prior cardiac history who presented to the [facility] ED today complaining of chest pain. Her troponin was noted to be elevated and she was sent to [facility] for further management.   She complains of intermittent, aching substernal CP that radiated to her left arm beginning last night at 5:00 PM. She was restless all night and in the morning experienced intermittent chest pain lasting about 10 minutes at a time. She also noted associated lightheadedness, nausea, lower back pain, diaphoresis and SOB. Patient rated the pain as a 4/10 in severity. She is not having any chest discomfort at this time. She has no prior history of similar episodes or prior cardiac disease. She denies PND, orthopnea or LE edema. Her mother recently had a MI and she is currently on FMLA to help care for her. She currently works for a group home. She does not smoke and has no history of bleeding.   She was admitted and under went left heart cath which revealed normal coronary arteries and evidence of takotsubo cardiomyopathy.  She was started on Coreg 3.125mg  twice daily.  2D echo revealed EF of 20-25%, grade 2 diastolic dysfunction.  Will repeat echo in about 2-3 months.  She had no NVST during her admission.  She ambulated 511ft with cardiac rehab and no complaints.  She was discharged on PRN lasix.  The patient was seen by Dr. Eden Emms who felt she was stable for DC home.    Consults: None  Significant Diagnostic Studies:  Left heart    FINDINGS:  Hemodynamics:  Central Aortic Pressure / Mean: 132/75/99 mmHg  Left Ventricular Pressure / LVEDP: 124/9/12 mmHg Left Ventriculography:  EF: Roughly 25 %  Wall Motion: The basal walls contract relatively normally as does the apex but the inferior and anterior mid walls are severely hypokinetic,. There is also mild mitral valve prolapse with no significant regurgitation.  Coronary Anatomy:  Dominance: Right  Left Main: Large-caliber, long vessel that bifurcates distally into the LAD and then extends to branch into a Ramus Intermedius and Circumflex. LAD: Normal caliber vessel that tapers into a relatively small caliber vessel down around the apex. It is somewhat tortuous course and gives off one major diagonal branch. As reaching down around the the apex perfusing the inferoapex.  D1: Small to moderate caliber vessel that comes off in the LAD. Angiographically normal but tortuous Left Circumflex: The extension of the left main gives off a small AV groove circumflex bifurcates into a small OM and posterolateral branch with continuing AV groove branch is diminutive. Minimal luminal irregularities.  Ramus intermedius: The continued left main gives off a early bifurcating ramus intermedius one branch coursing as a diagonal and one is an OM. Both are tortuous and small-to-moderate in caliber.  RCA: Moderate caliber tortuous vessel with a downward takeoff to anterior. The first bend is actually upward and then his very tortuous in the mid vessel. It gives off a very small Right Posterior AV Groove Branch (RPAV) that has a small posterior lateral branch prior to terminating  as the small and moderate caliber tortuous RPDA. Minimal luminal irregularities. MEDICATIONS:  Anesthesia: Local Lidocaine 2 ml Sedation: 1 mg IV Versed, 25 mcg IV fentanyl  Omnipaque Contrast: 75 ml  Anticoagulation: IV Heparin 2500 Units Radial Cocktail: 5 mg Verapamil, 400 mcg NTG, 2 ml 2% Lidocaine in 10 ml NS PATIENT  DISPOSITION:  The patient was transferred to the PACU holding area in a hemodynamicaly stable, chest pain free condition.  The patient tolerated the procedure well, and there were no complications. EBL: < 5 ml  The patient was stable before, during, and after the procedure. 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  Ejection fraction estimated roughly 25% with basal contraction along with apical contraction but mid anterior and inferior hypokinesis PLAN OF CARE:  The patient will return to the TCU for ongoing care. Standard post radial cath care.  Initiate CHF/cardiomyopathy medical regimen. I have started low-dose carvedilol. She sees relatively compensated with normal LVEDP  Krystal Travis, M.D., M.S.  [facility]  [address]  [place], [place] [address]  [phone]  04/21/2014   2D Echo Study Conclusions  - Left ventricle: The cavity size was normal. Wall thickness was normal. Systolic function was severely reduced. The estimated ejection fraction was in the range of 20% to 25%. There is severe hypokinesis of the mid-apicalanteroseptal, anterior, anterolateral, inferior, inferoseptal, and apical myocardium. Features are consistent with a pseudonormal left ventricular filling pattern, with concomitant abnormal relaxation and increased filling pressure (grade 2 diastolic dysfunction). Doppler parameters are consistent with elevated ventricular end-diastolic filling pressure. - Aortic valve: Mildly calcified annulus. Trileaflet. There was no significant regurgitation. - Mitral valve: There was mild to moderate regurgitation directed posteriorly. - Left atrium: The atrium was mildly dilated. - Right ventricle: Systolic function was low normal. - Right atrium: Central venous pressure (est): 3 mm Hg. - Tricuspid valve:  There was trivial regurgitation. - Pulmonary arteries: Systolic pressure could not be accurately estimated. - Pericardium, extracardiac: A trivial pericardial effusion was identified.  Lipid Panel     Component Value Date/Time   CHOL 231* 04/21/2014 0123   TRIG 82 04/21/2014 0123   HDL 95 04/21/2014 0123   CHOLHDL 2.4 04/21/2014 0123   VLDL 16 04/21/2014 0123   LDLCALC 120* 04/21/2014 0123   Cardiac Panel (last 3 results)  Recent Labs  04/20/14 2124 04/21/14 0123 04/21/14 1055  TROPONINI 1.40* 1.24* 0.73*      Treatments: See above   Discharge Exam: Blood pressure 101/56, pulse 60, temperature 98.1 F (36.7 C), temperature source Oral, resp. rate 17, height 5\' 6"  (1.676 m), weight 114 lb (51.71 kg), SpO2 100.00%.   Disposition: Final discharge disposition not confirmed      Discharge Instructions   Amb Referral to Cardiac Rehabilitation    Complete by:  As directed   [place]     Diet - low sodium heart healthy    Complete by:  As directed      Discharge instructions    Complete by:  As directed   Monitor your weight daily. If you gain 2 pounds in 24 hours or 5 pounds in a week, call the office for instructions.     Increase activity slowly    Complete by:  As directed             Medication List    STOP taking these medications  verapamil 240 MG CR tablet  Commonly known as:  CALAN-SR      TAKE these medications       carvedilol 3.125 MG tablet  Commonly known as:  COREG  Take 1 tablet (3.125 mg total) by mouth 2 (two) times daily with a meal.     cloNIDine 0.1 MG tablet  Commonly known as:  CATAPRES  Take 0.1 mg by mouth 2 (two) times daily.     doxepin 75 MG capsule  Commonly known as:  SINEQUAN  Take 75 mg by mouth daily.     furosemide 20 MG tablet  Commonly known as:  LASIX  Take 1 tablet (20 mg total) by mouth daily as needed.     omeprazole 20 MG capsule  Commonly known as:  PRILOSEC  Take 20 mg by mouth 2 (two) times daily.         Follow-up Information   Follow up with Tereso Newcomer, PA-C On 05/19/2014. (10:10AM)    Specialty:  Physician Assistant   Contact information:   [address] [place] [address] [phone]      Greater than 30 minutes was spent completing the patient's discharge.    SignedWilburt Finlay 04/22/2014, 2:36 PM

## 2014-04-22 NOTE — Progress Notes (Addendum)
Pharmacist Heart Failure Core Measure Documentation  Assessment: Krystal Travis has an EF documented as 20-25% on 04/21/2014 by 2D Echo.  Rationale: Heart failure patients with left ventricular systolic dysfunction (LVSD) and an EF < 40% should be prescribed an angiotensin converting enzyme inhibitor (ACEI) or angiotensin receptor blocker (ARB) at discharge unless a contraindication is documented in the medical record.  This patient is not currently on an ACEI or ARB for HF.  Please consider adding an ACEI or ARB if her BP can tolerate it.  This note is being placed in the record in order to provide documentation that a contraindication to the use of these agents is present for this encounter.  ACE Inhibitor or Angiotensin Receptor Blocker is contraindicated (specify all that apply)  []   ACEI allergy AND ARB allergy []   Angioedema []   Moderate or severe aortic stenosis []   Hyperkalemia [x]   Hypotension, BP 90-119/56-67 []   Renal artery stenosis []   Worsening renal function, preexisting renal disease or dysfunction  Thanks.  Riki Rusk 04/22/2014 1:34 PM

## 2014-04-22 NOTE — Progress Notes (Signed)
CARDIAC REHAB PHASE I   PRE:  Rate/Rhythm: 74 SR    BP: sitting 110/66    SaO2:   MODE:  Ambulation: 500 ft   POST:  Rate/Rhythm: 96 SR    BP: sitting 140/74     SaO2:   Tolerated well, no c/o. Feels good. Ed completed with good reception. Interested in CPRII at [place] and will send referral.  [phone] Harriet Masson CES, ACSM 04/22/2014 10:25 AM

## 2014-04-22 NOTE — Progress Notes (Signed)
Subjective: No CP or SOB  Objective: Vital signs in last 24 hours: Temp:  [97.7 F (36.5 C)-98.8 F (37.1 C)] 97.7 F (36.5 C) (06/19 0503) Pulse Rate:  [65-139] 65 (06/19 0503) Resp:  [15-19] 18 (06/19 0503) BP: (98-142)/(56-67) 119/67 mmHg (06/19 0503) SpO2:  [93 %-100 %] 100 % (06/19 0503) Weight:  [114 lb (51.71 kg)] 114 lb (51.71 kg) (06/19 0503) Last BM Date: 04/20/14  Intake/Output from previous day: 06/18 0701 - 06/19 0700 In: 909.1 [P.O.:480; I.V.:429.1] Out: -  Intake/Output this shift:    Medications Current Facility-Administered Medications  Medication Dose Route Frequency Provider Last Rate Last Dose  . 0.9 %  sodium chloride infusion   Intravenous Continuous Vanetta Mulders, MD 51 mL/hr at 04/21/14 0900    . acetaminophen (TYLENOL) tablet 650 mg  650 mg Oral Q4H PRN Cassell Clement, MD      . aspirin EC tablet 81 mg  81 mg Oral Daily Cassell Clement, MD   81 mg at 04/21/14 0957  . atorvastatin (LIPITOR) tablet 80 mg  80 mg Oral q1800 Cassell Clement, MD   80 mg at 04/21/14 1639  . carvedilol (COREG) tablet 3.125 mg  3.125 mg Oral BID WC Marykay Lex, MD   3.125 mg at 04/21/14 1639  . cloNIDine (CATAPRES) tablet 0.1 mg  0.1 mg Oral BID Cassell Clement, MD   0.1 mg at 04/21/14 2133  . doxepin (SINEQUAN) capsule 75 mg  75 mg Oral Daily Cassell Clement, MD   75 mg at 04/21/14 0957  . heparin injection 5,000 Units  5,000 Units Subcutaneous 3 times per day Cassell Clement, MD   5,000 Units at 04/22/14 0548  . nitroGLYCERIN (NITROSTAT) SL tablet 0.4 mg  0.4 mg Sublingual Q5 Min x 3 PRN Cassell Clement, MD      . ondansetron [facility]) injection 4 mg  4 mg Intravenous Q6H PRN Cassell Clement, MD      . pantoprazole (PROTONIX) EC tablet 40 mg  40 mg Oral QAC breakfast Cassell Clement, MD   40 mg at 04/21/14 0957  . pneumococcal 23 valent vaccine (PNU-IMMUNE) injection 0.5 mL  0.5 mL Intramuscular Tomorrow-1000 Cassell Clement, MD        PE: General  appearance: alert, cooperative and no distress Lungs: clear to auscultation bilaterally Heart: regular rate and rhythm and 1/6 sys MM Extremities: No LEE Pulses: 2+ and symmetric Skin: Warm and dry Neurologic: Grossly normal  Lab Results:   Recent Labs  04/20/14 1315 04/21/14 0500 04/22/14 0500  WBC 9.8 8.9 5.7  HGB 14.7 12.3 11.0*  HCT 42.8 36.3 33.2*  PLT 353 285 243   BMET  Recent Labs  04/20/14 1315 04/21/14 0123  NA 144 143  K 4.0 3.7  CL 102 104  CO2 24 23  GLUCOSE 143* 104*  BUN 17 17  CREATININE 1.11* 1.10  CALCIUM 10.7* 9.8   PT/INR  Recent Labs  04/21/14 0123  LABPROT 14.1  INR 1.11   Cholesterol  Recent Labs  04/21/14 0123  CHOL 231*   Lipid Panel     Component Value Date/Time   CHOL 231* 04/21/2014 0123   TRIG 82 04/21/2014 0123   HDL 95 04/21/2014 0123   CHOLHDL 2.4 04/21/2014 0123   VLDL 16 04/21/2014 0123   LDLCALC 120* 04/21/2014 0123     Cardiac Enzymes No components found with this basename: TROPONIN,  CKMB,   Studies/Results: @RISRSLT2 @   Assessment/Plan  64 y.o. Female with a history of  HTN, GERD and no prior cardiac history who presented to the [facility] ED today complaining of chest pain. Her troponin was noted to be elevated and [facility].  She complains of intermittent, aching substernal CP that radiated to her left arm beginning last night at 5:00 PM. She was restless all night and in the morning experienced intermittent chest pain lasting about 10 minutes at a time. She also noted associated lightheadedness, nausea, lower back pain, diaphoresis and SOB. Patient rated the pain as a 4/10 in severity. She is not having any chest discomfort at this time. She has no prior history of similar episodes or prior cardiac disease. She denies PND, orthopnea or LE edema. Her mother recently had a MI and she is currently on FMLA to help care for her. She currently works for a group home. She does not smoke and has  no history of bleeding.  Active Problems:   NSTEMI (non-ST elevated myocardial infarction)   HTN (hypertension)   Takotsubo cardiomyopathy   Plan:   SP LHC revealing normal coronary arteries.  LVgram EF 25%.  Normal LVEDP.  On coreg.  Troponin 2.08.  BP and HR stable and well controlled.  Echo EF 20-25%, severe hypokinesis of the mid-apicalanteroseptal, anterior, anterolateral, inferior, inferoseptal, and apical myocardium.  Grade two diastolic dysfunction.  We discussed daily weight monitoring and low sodium diet.  Will send home with PRN lasix.  A lifevest was discussed.  She has not had any NSVT on tele in the last 24 hours.  Repeat echo in 3 months.   Lipitor was started at adm.   Triglycerides and HDL are excellent.  Will DC.      LOS: 2 days    HAGER, BRYAN PA-C 04/22/2014 7:44 AM  Patient examined chart reviewed  Agree with medical Rx for Takatsubo or NIDCM  Right radial A Post cath D/C today  Charlton HawsPeter Doretha Goding

## 2014-04-22 NOTE — Progress Notes (Signed)
Patient was discharged home by MD order; discharged instructions  review and give to patient with care notes; IV DIC; skin intact; patient will be escorted to the car by nurse tech via wheelchair.  

## 2014-05-17 ENCOUNTER — Encounter: Payer: Self-pay | Admitting: *Deleted

## 2014-05-19 ENCOUNTER — Encounter: Payer: Self-pay | Admitting: Physician Assistant

## 2014-05-19 ENCOUNTER — Ambulatory Visit (INDEPENDENT_AMBULATORY_CARE_PROVIDER_SITE_OTHER): Payer: BC Managed Care – PPO | Admitting: Physician Assistant

## 2014-05-19 VITALS — BP 110/70 | HR 59 | Ht 66.0 in | Wt 111.0 lb

## 2014-05-19 DIAGNOSIS — I5181 Takotsubo syndrome: Secondary | ICD-10-CM

## 2014-05-19 DIAGNOSIS — I10 Essential (primary) hypertension: Secondary | ICD-10-CM

## 2014-05-19 NOTE — Progress Notes (Signed)
Cardiology Office Note    Date:  05/19/2014   ID:  Krystal Travis, DOB [birth year], MRN [id number]  PCP:  Wynona Meals, FNP  Cardiologist:  Dr. Cassell Clement      History of Present Illness: Krystal Travis is a 63 y.o. female with a history of HTN, GERD. She was admitted 6/17-6/19 with a non-STEMI. Cardiac catheterization demonstrated normal coronary arteries. Echocardiogram demonstrated reduced LV function with EF of 20-25% and wall motion abnormalities consistent with Tako-Tsubo cardiomyopathy. Medications were adjusted for her cardiomyopathy. She returns for follow up.  She is doing very well.  The patient denies chest pain, shortness of breath, syncope, orthopnea, PND or significant pedal edema.    Studies:  - LHC (04/21/14):   EF 25%, inferior and anterior hypokinesis, normal coronary arteries   - Echo (04/21/14):   EF 20-25%, severe hypokinesis of the mid-apicalanteroseptal, anterior, anterolateral, inferior, inferoseptal, and apical myocardium, grade 2 diastolic dysfunction, mild to moderate MR, mild LAE  Recent Labs: 04/20/2014: Pro B Natriuretic peptide (BNP) 14801.0*  04/21/2014: Creatinine 1.10; HDL Cholesterol by NMR 95; LDL (calc) 120*; Potassium 3.7  04/22/2014: Hemoglobin 11.0*   Wt Readings from Last 3 Encounters:  05/19/14 111 lb (50.349 kg)  04/22/14 114 lb (51.71 kg)  04/22/14 114 lb (51.71 kg)     Past Medical History  Diagnosis Date  . Hypertension   . GERD (gastroesophageal reflux disease)   . 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 LHC (6/15):  EF 25%, inferior and anterior hypokinesis, normal coronary arteries  . Hx of echocardiogram     a. Echo (6/15):  EF 20-25%, anteroseptal, anterior, anterolateral, inferior, inferoseptal, apical severe HK, Gr 2 DD, mild to mod MR, mild LAE, low normal RVSF, trivial TR, trivial eff    Current Outpatient Prescriptions  Medication Sig Dispense Refill  . antipyrine-benzocaine (AURALGAN) otic solution Place 3-4  drops into the left ear every 2 (two) hours as needed. For Fluid in ear      . carvedilol (COREG) 3.125 MG tablet Take 1 tablet (3.125 mg total) by mouth 2 (two) times daily with a meal.  60 tablet  5  . cloNIDine (CATAPRES) 0.1 MG tablet Take 0.1 mg by mouth 2 (two) times daily.      . cyproheptadine (PERIACTIN) 4 MG tablet Take 4 mg by mouth 2 (two) times daily. Sneezing,itching,runny nose      . doxepin (SINEQUAN) 75 MG capsule Take 75 mg by mouth daily.      . fluticasone (FLONASE) 50 MCG/ACT nasal spray Place 2 sprays into both nostrils daily. prn      . furosemide (LASIX) 20 MG tablet Take 1 tablet (20 mg total) by mouth daily as needed.  30 tablet  1  . omeprazole (PRILOSEC) 20 MG capsule Take 20 mg by mouth 2 (two) times daily.       No current facility-administered medications for this visit.    Allergies:   Asa   Social History:  The patient  reports that she quit smoking about 8 years ago. She does not have any smokeless tobacco history on file. She reports that she drinks alcohol. She reports that she does not use illicit drugs.   Family History:  The patient's family history includes Cancer in her father; Depression in her mother and sister; Fainting in her mother; Heart attack in her mother; Heart failure in her mother; Hyperlipidemia in her mother; Hypertension in her mother and sister; Kidney disease in her mother.  ROS:  Please see the history of present illness.      All other systems reviewed and negative.   PHYSICAL EXAM: VS:  BP 110/70  Pulse 59  Ht 5\' 6"  (1.676 m)  Wt 111 lb (50.349 kg)  BMI 17.92 kg/m2 Well nourished, well developed, in no acute distress HEENT: normal Neck: no JVD Cardiac:  normal S1, S2; RRR; no murmur Lungs:  clear to auscultation bilaterally, no wheezing, rhonchi or rales Abd: soft, nontender, no hepatomegaly Ext: no edemaright wrist without hematoma or mass  Skin: warm and dry Neuro:  CNs 2-12 intact, no focal abnormalities noted  EKG:   Sinus bradycardia, HR 59, normal axis, nonspecific ST-T wave changes     ASSESSMENT AND PLAN:  1. Takotsubo cardiomyopathy:  She is doing very well since discharge from the hospital. She has no symptoms of chest pain or shortness of breath. Her ECG changes have completely resolved. She is on carvedilol. However, she is not on an ACE inhibitor. I will obtain an echocardiogram in about 2 weeks. This will be 6 weeks out from her initial event. If her ejection fraction has not completely recovered, I will consider placing her on low-dose ARB (history of cough with ACE inhibitor) and possibly discontinuing her clonidine. 2. Essential hypertension:  Controlled. 3. Disposition: Follow up with Dr. Cassell Clementhomas Brackbill  in 2 months. Work note given to return on 05/30/14 today.   Signed, Brynda RimScott Weaver, PA-C, MHS 05/19/2014 10:56 AM    [facility] [address], [place], [place]  [address] Phone: [phone]; Fax: [phone]

## 2014-05-19 NOTE — Patient Instructions (Addendum)
Your physician has requested that you have an echocardiogram DX 429.83; PER SCOT [employer]ER, PAC THIS NEEDS TO BE DONE IN 2 WEEKS WHICH WILL BE 6 WEEKS FROM PT'S MI. Echocardiography is a painless test that uses sound waves to create images of your heart. It provides your doctor with information about the size and shape of your heart and how well your heart's chambers and valves are working. This procedure takes approximately one hour. There are no restrictions for this procedure.  Your physician recommends that you schedule a follow-up appointment in: 2 MONTHS WITH DR. Patty Sermons

## 2014-06-03 ENCOUNTER — Ambulatory Visit (HOSPITAL_COMMUNITY): Payer: BC Managed Care – PPO | Attending: Cardiology | Admitting: Radiology

## 2014-06-03 DIAGNOSIS — R9389 Abnormal findings on diagnostic imaging of other specified body structures: Secondary | ICD-10-CM | POA: Insufficient documentation

## 2014-06-03 DIAGNOSIS — I5181 Takotsubo syndrome: Secondary | ICD-10-CM | POA: Insufficient documentation

## 2014-06-03 DIAGNOSIS — I10 Essential (primary) hypertension: Secondary | ICD-10-CM | POA: Insufficient documentation

## 2014-06-03 DIAGNOSIS — Z87891 Personal history of nicotine dependence: Secondary | ICD-10-CM | POA: Insufficient documentation

## 2014-06-03 DIAGNOSIS — I252 Old myocardial infarction: Secondary | ICD-10-CM | POA: Insufficient documentation

## 2014-06-03 NOTE — Progress Notes (Signed)
Echocardiogram performed.  

## 2014-06-06 ENCOUNTER — Telehealth: Payer: Self-pay | Admitting: *Deleted

## 2014-06-06 ENCOUNTER — Encounter: Payer: Self-pay | Admitting: Physician Assistant

## 2014-06-06 NOTE — Telephone Encounter (Signed)
pt notified about echo results with verbal understanding 

## 2014-07-19 ENCOUNTER — Ambulatory Visit: Payer: BC Managed Care – PPO | Admitting: Cardiology

## 2014-09-22 ENCOUNTER — Encounter (HOSPITAL_COMMUNITY): Payer: Self-pay

## 2014-09-22 ENCOUNTER — Emergency Department (HOSPITAL_COMMUNITY)
Admission: EM | Admit: 2014-09-22 | Discharge: 2014-09-22 | Disposition: A | Discharge status: Home / Self Care | Payer: BC Managed Care – PPO | Attending: Emergency Medicine | Admitting: Emergency Medicine

## 2014-09-22 DIAGNOSIS — Z79899 Other long term (current) drug therapy: Secondary | ICD-10-CM | POA: Diagnosis not present

## 2014-09-22 DIAGNOSIS — K219 Gastro-esophageal reflux disease without esophagitis: Secondary | ICD-10-CM | POA: Diagnosis not present

## 2014-09-22 DIAGNOSIS — Z87891 Personal history of nicotine dependence: Secondary | ICD-10-CM | POA: Insufficient documentation

## 2014-09-22 DIAGNOSIS — Z7951 Long term (current) use of inhaled steroids: Secondary | ICD-10-CM | POA: Diagnosis not present

## 2014-09-22 DIAGNOSIS — I10 Essential (primary) hypertension: Secondary | ICD-10-CM | POA: Insufficient documentation

## 2014-09-22 DIAGNOSIS — H9201 Otalgia, right ear: Secondary | ICD-10-CM | POA: Insufficient documentation

## 2014-09-22 MED ORDER — LORATADINE-PSEUDOEPHEDRINE ER 5-120 MG PO TB12: 1.0000 | ORAL_TABLET | Freq: Two times a day (BID) | ORAL | Status: AC

## 2014-09-22 MED ORDER — HYDROCODONE-ACETAMINOPHEN 5-325 MG PO TABS: 1.0000 | ORAL_TABLET | ORAL | Status: AC | PRN

## 2014-09-22 NOTE — ED Provider Notes (Signed)
CSN: [id number]     Arrival date & time 09/22/14  1100 History   First MD Initiated Contact with Patient 09/22/14 1308     Chief Complaint  Patient presents with  . Otalgia     (Consider location/radiation/quality/duration/timing/severity/associated sxs/prior Treatment) Patient is a 63 y.o. female presenting with ear pain. The history is provided by the patient.  Otalgia Location:  Right Behind ear:  No abnormality Quality:  Sharp Severity:  Moderate Duration:  1 day Timing:  Intermittent Progression:  Worsening Chronicity:  New Context: not foreign body in ear, not loud noise and no water in ear   Relieved by:  Nothing Worsened by:  Nothing tried Ineffective treatments:  OTC medications Associated symptoms: congestion   Associated symptoms: no abdominal pain, no cough, no fever and no neck pain     Past Medical History  Diagnosis Date  . Hypertension   . GERD (gastroesophageal reflux disease)   . 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 LHC (6/15):  EF 25%, inferior and anterior hypokinesis, normal coronary arteries  . Hx of echocardiogram 04/2014    a. Echo (6/15):  EF 20-25%, anteroseptal, anterior, anterolateral, inferior, inferoseptal, apical severe HK, Gr 2 DD, mild to mod MR, mild LAE, low normal RVSF, trivial TR, trivial eff  . Hx of echocardiogram 05/2014    a. Echo (7/15): EF 55-60%, normal wall motion, moderate MR, moderate TR, PASP 49 mm Hg, small effusion   History reviewed. No pertinent past surgical history. Family History  Problem Relation Age of Onset  . Heart attack Mother   . Cancer Father   . Depression Mother   . Depression Sister   . Heart failure Mother   . Hyperlipidemia Mother   . Hypertension Mother   . Hypertension Sister   . Kidney disease Mother   . Fainting Mother    History  Substance Use Topics  . Smoking status: Former Smoker    Quit date: 04/20/2006  . Smokeless tobacco: Not on file  . Alcohol Use: Yes     Comment: occasional    OB History    No data available     Review of Systems  Constitutional: Negative for fever and activity change.       All ROS Neg except as noted in HPI  HENT: Positive for congestion, ear pain and sinus pressure. Negative for nosebleeds.   Eyes: Negative for photophobia and discharge.  Respiratory: Negative for cough, shortness of breath and wheezing.   Cardiovascular: Negative for chest pain and palpitations.  Gastrointestinal: Negative for abdominal pain and blood in stool.  Genitourinary: Negative for dysuria, frequency and hematuria.  Musculoskeletal: Negative for back pain, arthralgias and neck pain.  Skin: Negative.   Neurological: Negative for dizziness, seizures and speech difficulty.  Psychiatric/Behavioral: Negative for hallucinations and confusion.      Allergies  Asa  Home Medications   Prior to Admission medications   Medication Sig Start Date End Date Taking? Authorizing Provider  acetaminophen (TYLENOL) 500 MG tablet Take 1,000 mg by mouth every 6 (six) hours as needed (EAR PAIN/SORE THROAT).   Yes Historical Provider, MD  carvedilol (COREG) 3.125 MG tablet Take 1 tablet (3.125 mg total) by mouth 2 (two) times daily with a meal. 04/22/14  Yes Dwana Melena, PA-C  cloNIDine (CATAPRES) 0.1 MG tablet Take 0.1 mg by mouth 2 (two) times daily. 04/01/14  Yes Historical Provider, MD  cyproheptadine (PERIACTIN) 4 MG tablet Take 4 mg by mouth 3 (three) times daily.  Sneezing,itching,runny nose 05/10/14  Yes Historical Provider, MD  doxepin (SINEQUAN) 75 MG capsule Take 75 mg by mouth daily. 03/24/14  Yes Historical Provider, MD  fluticasone (FLONASE) 50 MCG/ACT nasal spray Place 2 sprays into both nostrils daily as needed for allergies.  05/10/14  Yes Historical Provider, MD  guaiFENesin (MUCINEX) 600 MG 12 hr tablet Take 1,200 mg by mouth 2 (two) times daily as needed for cough or to loosen phlegm.   Yes Historical Provider, MD  omeprazole (PRILOSEC) 20 MG capsule Take 20 mg by  mouth 2 (two) times daily. 04/01/14  Yes Historical Provider, MD  sodium chloride (OCEAN) 0.65 % SOLN nasal spray Place 1 spray into both nostrils as needed for congestion.   Yes Historical Provider, MD  furosemide (LASIX) 20 MG tablet Take 1 tablet (20 mg total) by mouth daily as needed. Patient not taking: Reported on 09/22/2014 04/22/14   Kelle DartingBryan W Hager, PA-C   BP 177/90 mmHg  Pulse 80  Temp(Src) 97.9 F (36.6 C) (Oral)  Resp 16  SpO2 100% Physical Exam  Constitutional: She is oriented to person, place, and time. She appears well-developed and well-nourished.  Non-toxic appearance.  HENT:  Head: Normocephalic.  Right Ear: Tympanic membrane and external ear normal.  Left Ear: Tympanic membrane and external ear normal.  Mild fluid  behind the TM on the right. No bulging Nasal congestion  Eyes: EOM and lids are normal. Pupils are equal, round, and reactive to light.  Neck: Normal range of motion. Neck supple. Carotid bruit is not present.  Cardiovascular: Normal rate, regular rhythm, normal heart sounds, intact distal pulses and normal pulses.   Pulmonary/Chest: Breath sounds normal. No respiratory distress.  Abdominal: Soft. Bowel sounds are normal. There is no tenderness. There is no guarding.  Musculoskeletal: Normal range of motion.  Lymphadenopathy:       Head (right side): No submandibular adenopathy present.       Head (left side): No submandibular adenopathy present.    She has no cervical adenopathy.  Neurological: She is alert and oriented to person, place, and time. She has normal strength. No cranial nerve deficit or sensory deficit.  Skin: Skin is warm and dry.  Psychiatric: She has a normal mood and affect. Her speech is normal.  Nursing note and vitals reviewed.   ED Course  Procedures (including critical care time) Labs Review Labs Reviewed - No data to display  Imaging Review No results found.   EKG Interpretation None      MDM B/P elevated at 177/90,  otherwise the vitals are wnl.  No evidence for mastoiditis or acute ear infection.  Plan to treat with claritin D an norco for pain. Pt to follow up with primary MD.   Final diagnoses:  Otalgia of right ear    **I have reviewed nursing notes, vital signs, and all appropriate lab and imaging results for this patient.Kathie Dike*    Cagney Degrace M Glenford Garis, PA-C 09/26/14 1106  Samuel JesterKathleen McManus, DO 09/26/14 [phone]

## 2014-09-22 NOTE — Discharge Instructions (Signed)
Please increase fluids. Use Claritin-D every 12 hours for congestion. Use Norco for pain if needed. Norco may cause drowsiness, please use with caution. Please increase fluids, please wash hands frequently. Please see your primary physician or return to the emergency department if not improving.

## 2014-09-22 NOTE — ED Notes (Signed)
Rt ear pain

## 2014-10-13 ENCOUNTER — Encounter (HOSPITAL_COMMUNITY): Payer: Self-pay | Admitting: Cardiology

## 2015-02-03 ENCOUNTER — Ambulatory Visit
Admit: 2015-02-03 | Disposition: A | Discharge status: Home / Self Care | Payer: Self-pay | Attending: Internal Medicine | Admitting: Internal Medicine

## 2015-03-01 ENCOUNTER — Inpatient Hospital Stay: Admit: 2015-03-01 | Disposition: E | Payer: Self-pay | Attending: Internal Medicine | Admitting: Internal Medicine

## 2015-03-01 DIAGNOSIS — I214 Non-ST elevation (NSTEMI) myocardial infarction: Secondary | ICD-10-CM

## 2015-03-01 DIAGNOSIS — I428 Other cardiomyopathies: Secondary | ICD-10-CM | POA: Diagnosis not present

## 2015-03-01 DIAGNOSIS — I10 Essential (primary) hypertension: Secondary | ICD-10-CM | POA: Diagnosis not present

## 2015-03-01 DIAGNOSIS — I469 Cardiac arrest, cause unspecified: Secondary | ICD-10-CM

## 2015-03-05 DEATH — deceased

## 2016-09-27 IMAGING — CT CT HEAD WITHOUT CONTRAST
2 of 3 series · 16 of 30 positions shown, 19 images · non-contrast
Comparison: None.

CLINICAL DATA: Cardiac arrest, unresponsive on arrival with CPR in
progress.

EXAM:
CT HEAD WITHOUT CONTRAST
TECHNIQUE: Contiguous axial images were obtained from the base of the skull
through the vertex without intravenous contrast.

[Series 2: head wo · axial · 0.39mm/px · z∈[-238,-112]mm · 11 of 34 slices shown, 14 images]
[im 3/34  brain]
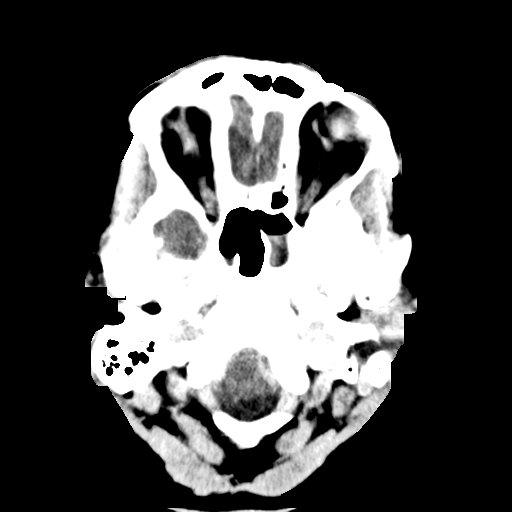
[im 3/34  bone]
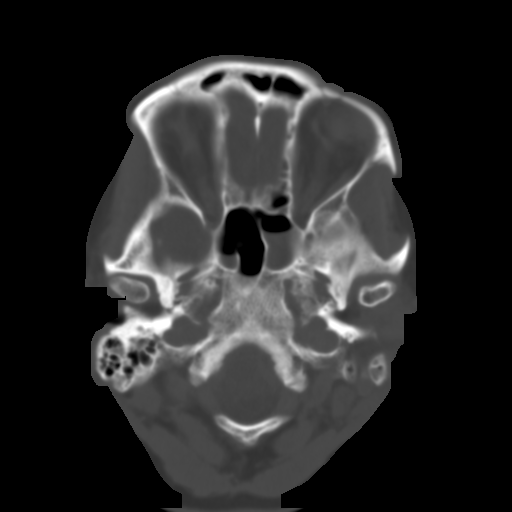
[im 6/34  brain]
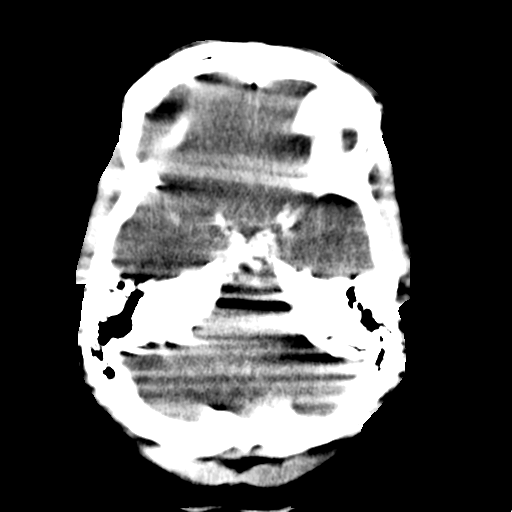
[im 9/34  brain]
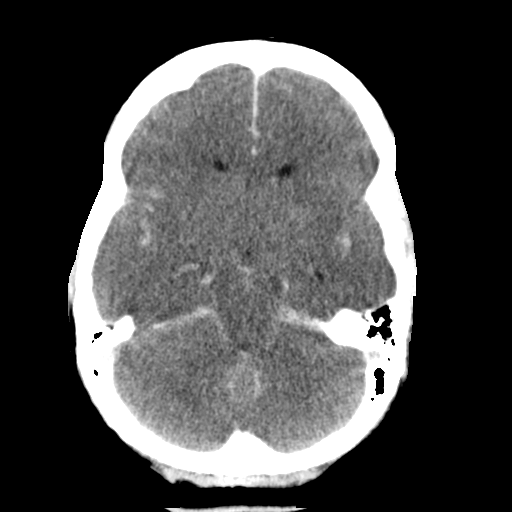
[im 12/34  brain]
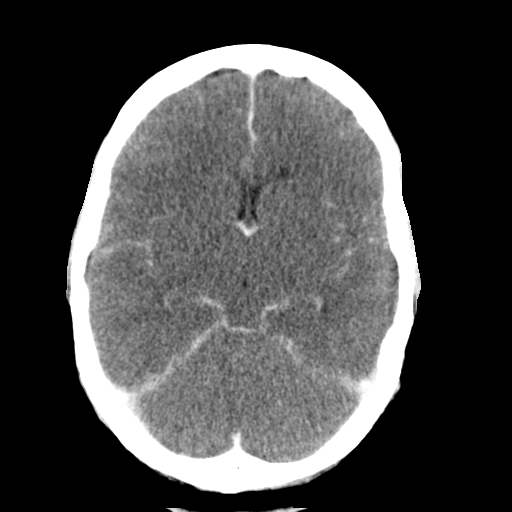
[im 14/34  brain]
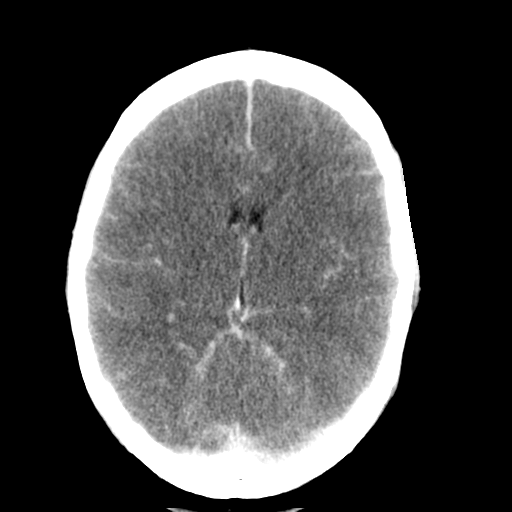
[im 14/34  bone]
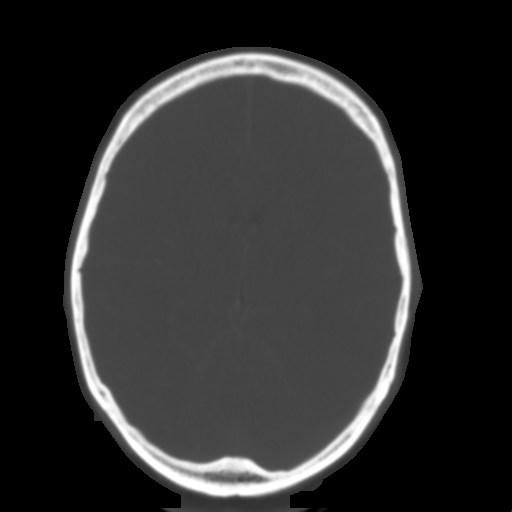
[im 17/34  brain]
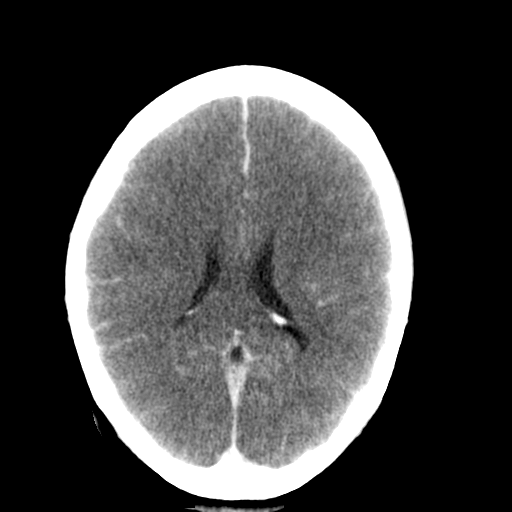
[im 20/34  brain]
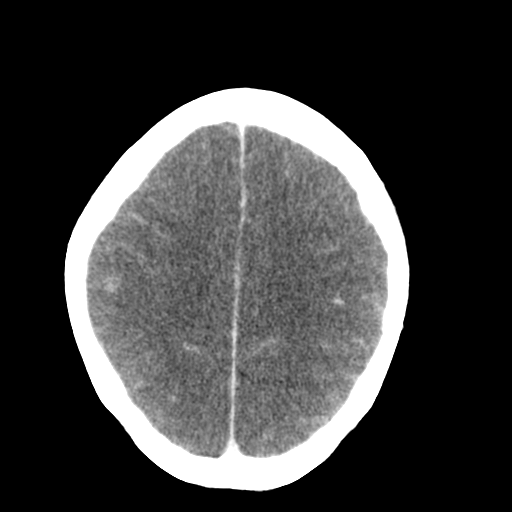
[im 23/34  brain]
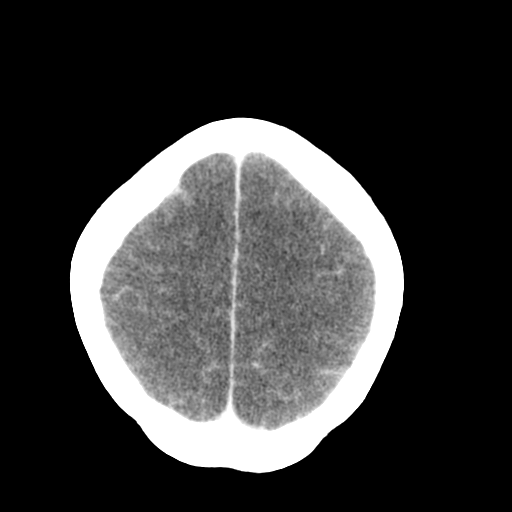
[im 25/34  brain]
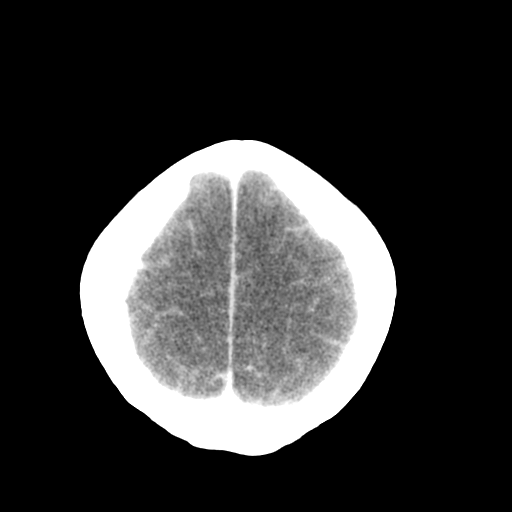
[im 25/34  bone]
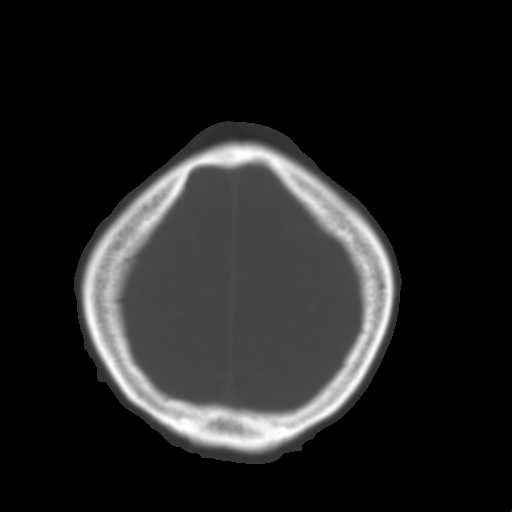
[im 28/34  brain]
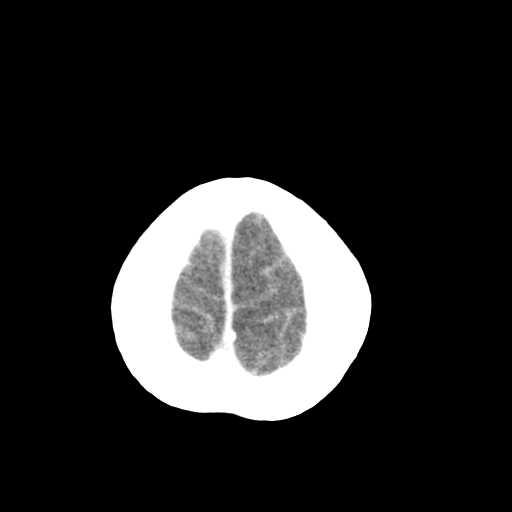
[im 31/34  brain]
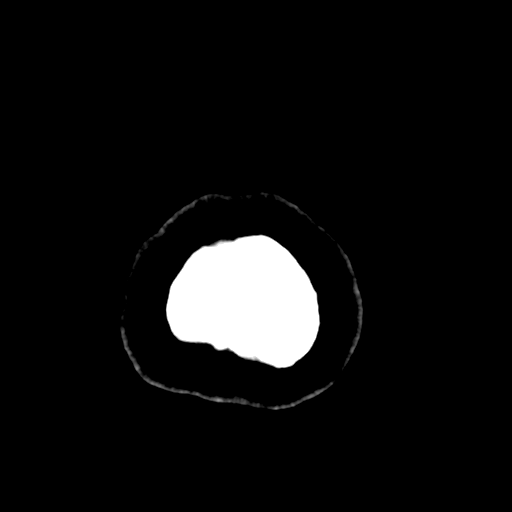

[Series 3: head bone · axial · 0.39mm/px · z∈[-238,-157]mm · 5 of 36 slices shown]
[im 3/36  bone]
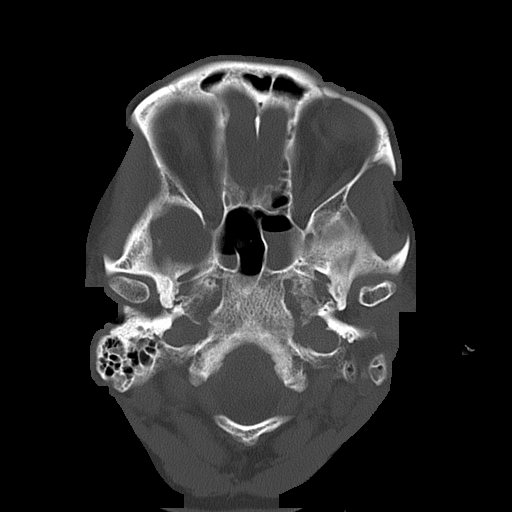
[im 9/36  bone]
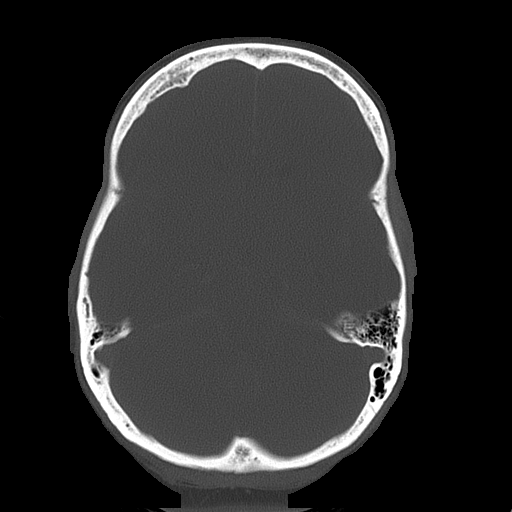
[im 12/36  bone]
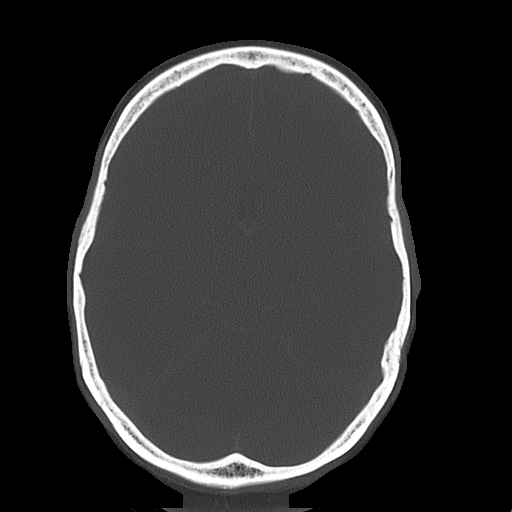
[im 15/36  bone]
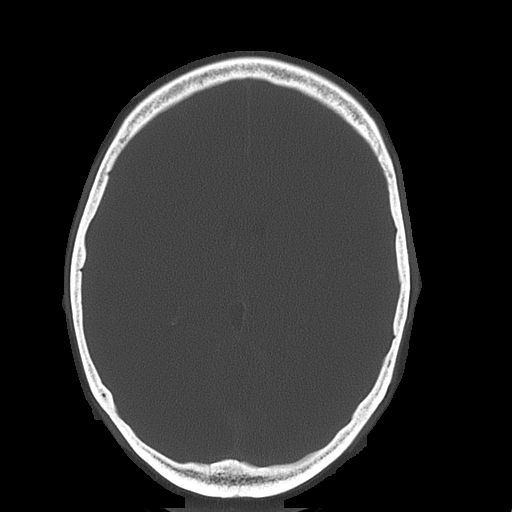
[im 21/36  bone]
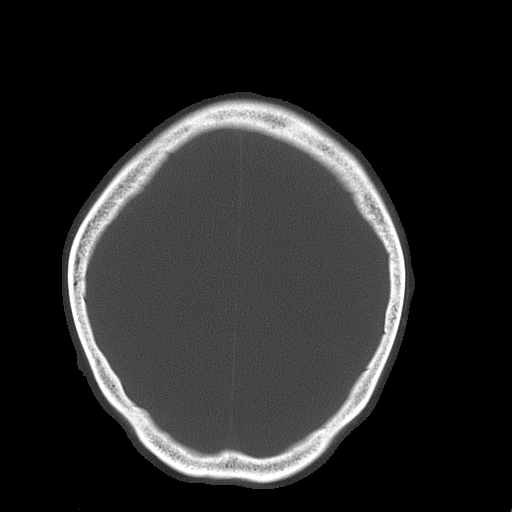

[16 of 30 positions shown; findings below may reference images not displayed]

FINDINGS: There is diffuse subarachnoid hemorrhage throughout the basilar
cisterns and lining the sulci of both cerebral hemispheres
diffusely. Intraventricular hemorrhage is suspected in the third and
fourth ventricles. The fourth ventricle is effaced.

Loss of gray-white junction differentiation compatible with diffuse
anoxic event. Moderate effacement of the lateral ventricles. Small
amount of subarachnoid hemorrhage lines the falx and tentorium.

Air-fluid levels in the sphenoid sinuses. Chronic ethmoid sinusitis.
IMPRESSION: 1. Diffuse subarachnoid hemorrhage in the basilar cisterns, sylvian
fissures, lining the cerebral hemispheres, and along the falx and
tentorium, with a small amount of intraventricular hemorrhage.
2. Loss of gray-white junction differentiation compatible with
diffuse severe anoxic event involving the cerebrum and cerebellum.
3. Acute sphenoid sinusitis and chronic ethmoid sinusitis.
Critical Value/emergent results were called by telephone at the time
of interpretation on 03/01/2015 at [DATE] to Dr. GRACY HANNIGAN , who
verbally acknowledged these results.
